# Patient Record
Sex: Female | Born: 1975 | Race: White | Hispanic: No | Smoking: Former smoker
Health system: Southern US, Community
[De-identification: ages and names within clinical notes are randomized; demographics above are authoritative.]

## PROBLEM LIST (undated history)

## (undated) DIAGNOSIS — M549 Dorsalgia, unspecified: Secondary | ICD-10-CM

## (undated) DIAGNOSIS — G8929 Other chronic pain: Secondary | ICD-10-CM

## (undated) DIAGNOSIS — F909 Attention-deficit hyperactivity disorder, unspecified type: Secondary | ICD-10-CM

## (undated) DIAGNOSIS — F32A Depression, unspecified: Secondary | ICD-10-CM

## (undated) DIAGNOSIS — E119 Type 2 diabetes mellitus without complications: Secondary | ICD-10-CM

## (undated) DIAGNOSIS — M1990 Unspecified osteoarthritis, unspecified site: Secondary | ICD-10-CM

## (undated) DIAGNOSIS — M7580 Other shoulder lesions, unspecified shoulder: Secondary | ICD-10-CM

## (undated) DIAGNOSIS — F329 Major depressive disorder, single episode, unspecified: Secondary | ICD-10-CM

## (undated) DIAGNOSIS — F419 Anxiety disorder, unspecified: Secondary | ICD-10-CM

## (undated) HISTORY — DX: Major depressive disorder, single episode, unspecified: F32.9

## (undated) HISTORY — DX: Attention-deficit hyperactivity disorder, unspecified type: F90.9

## (undated) HISTORY — DX: Anxiety disorder, unspecified: F41.9

## (undated) HISTORY — PX: CHOLECYSTECTOMY: SHX55

## (undated) HISTORY — PX: OTHER SURGICAL HISTORY: SHX169

## (undated) HISTORY — DX: Depression, unspecified: F32.A

## (undated) HISTORY — PX: TUBAL LIGATION: SHX77

---

## 2000-10-08 ENCOUNTER — Emergency Department (HOSPITAL_COMMUNITY): Admission: EM | Admit: 2000-10-08 | Discharge: 2000-10-08 | Payer: Self-pay | Admitting: Emergency Medicine

## 2000-10-26 ENCOUNTER — Inpatient Hospital Stay (HOSPITAL_COMMUNITY): Admission: AD | Admit: 2000-10-26 | Discharge: 2000-10-26 | Payer: Self-pay | Admitting: Obstetrics & Gynecology

## 2000-10-26 ENCOUNTER — Encounter: Payer: Self-pay | Admitting: *Deleted

## 2000-10-28 ENCOUNTER — Inpatient Hospital Stay (HOSPITAL_COMMUNITY): Admission: AD | Admit: 2000-10-28 | Discharge: 2000-10-28 | Payer: Self-pay | Admitting: Obstetrics & Gynecology

## 2001-01-28 ENCOUNTER — Emergency Department (HOSPITAL_COMMUNITY): Admission: EM | Admit: 2001-01-28 | Discharge: 2001-01-28 | Payer: Self-pay | Admitting: *Deleted

## 2001-06-23 ENCOUNTER — Emergency Department (HOSPITAL_COMMUNITY): Admission: EM | Admit: 2001-06-23 | Discharge: 2001-06-23 | Payer: Self-pay | Admitting: *Deleted

## 2001-08-01 ENCOUNTER — Inpatient Hospital Stay (HOSPITAL_COMMUNITY): Admission: AD | Admit: 2001-08-01 | Discharge: 2001-08-01 | Payer: Self-pay | Admitting: *Deleted

## 2002-07-13 ENCOUNTER — Inpatient Hospital Stay (HOSPITAL_COMMUNITY): Admission: EM | Admit: 2002-07-13 | Discharge: 2002-07-15 | Payer: Self-pay | Admitting: Emergency Medicine

## 2002-07-13 ENCOUNTER — Encounter: Payer: Self-pay | Admitting: Emergency Medicine

## 2003-10-25 ENCOUNTER — Emergency Department (HOSPITAL_COMMUNITY): Admission: EM | Admit: 2003-10-25 | Discharge: 2003-10-25 | Payer: Self-pay | Admitting: *Deleted

## 2003-10-27 ENCOUNTER — Emergency Department (HOSPITAL_COMMUNITY): Admission: EM | Admit: 2003-10-27 | Discharge: 2003-10-27 | Payer: Self-pay | Admitting: Emergency Medicine

## 2004-04-10 ENCOUNTER — Emergency Department (HOSPITAL_COMMUNITY): Admission: EM | Admit: 2004-04-10 | Discharge: 2004-04-10 | Payer: Self-pay | Admitting: Emergency Medicine

## 2004-04-20 ENCOUNTER — Observation Stay (HOSPITAL_COMMUNITY): Admission: EM | Admit: 2004-04-20 | Discharge: 2004-04-21 | Payer: Self-pay | Admitting: Emergency Medicine

## 2004-05-24 ENCOUNTER — Emergency Department (HOSPITAL_COMMUNITY): Admission: EM | Admit: 2004-05-24 | Discharge: 2004-05-24 | Payer: Self-pay | Admitting: Emergency Medicine

## 2004-05-28 ENCOUNTER — Emergency Department (HOSPITAL_COMMUNITY): Admission: EM | Admit: 2004-05-28 | Discharge: 2004-05-28 | Payer: Self-pay | Admitting: Emergency Medicine

## 2004-10-22 ENCOUNTER — Emergency Department (HOSPITAL_COMMUNITY): Admission: EM | Admit: 2004-10-22 | Discharge: 2004-10-22 | Payer: Self-pay | Admitting: *Deleted

## 2004-11-17 ENCOUNTER — Emergency Department (HOSPITAL_COMMUNITY): Admission: EM | Admit: 2004-11-17 | Discharge: 2004-11-17 | Payer: Self-pay | Admitting: Emergency Medicine

## 2004-12-07 ENCOUNTER — Emergency Department (HOSPITAL_COMMUNITY): Admission: EM | Admit: 2004-12-07 | Discharge: 2004-12-07 | Payer: Self-pay | Admitting: Emergency Medicine

## 2005-01-20 ENCOUNTER — Emergency Department (HOSPITAL_COMMUNITY): Admission: EM | Admit: 2005-01-20 | Discharge: 2005-01-20 | Payer: Self-pay | Admitting: Emergency Medicine

## 2005-01-22 ENCOUNTER — Emergency Department (HOSPITAL_COMMUNITY): Admission: EM | Admit: 2005-01-22 | Discharge: 2005-01-22 | Payer: Self-pay | Admitting: Emergency Medicine

## 2005-03-02 ENCOUNTER — Emergency Department (HOSPITAL_COMMUNITY): Admission: EM | Admit: 2005-03-02 | Discharge: 2005-03-03 | Payer: Self-pay | Admitting: Emergency Medicine

## 2005-04-21 ENCOUNTER — Emergency Department (HOSPITAL_COMMUNITY): Admission: EM | Admit: 2005-04-21 | Discharge: 2005-04-21 | Payer: Self-pay | Admitting: Emergency Medicine

## 2005-07-20 ENCOUNTER — Emergency Department (HOSPITAL_COMMUNITY): Admission: EM | Admit: 2005-07-20 | Discharge: 2005-07-20 | Payer: Self-pay | Admitting: Emergency Medicine

## 2005-08-11 ENCOUNTER — Emergency Department (HOSPITAL_COMMUNITY): Admission: EM | Admit: 2005-08-11 | Discharge: 2005-08-11 | Payer: Self-pay | Admitting: Emergency Medicine

## 2006-10-18 ENCOUNTER — Emergency Department (HOSPITAL_COMMUNITY): Admission: EM | Admit: 2006-10-18 | Discharge: 2006-10-18 | Payer: Self-pay | Admitting: Emergency Medicine

## 2008-03-06 ENCOUNTER — Emergency Department (HOSPITAL_COMMUNITY): Admission: EM | Admit: 2008-03-06 | Discharge: 2008-03-06 | Payer: Self-pay | Admitting: Emergency Medicine

## 2008-03-07 ENCOUNTER — Emergency Department (HOSPITAL_COMMUNITY): Admission: EM | Admit: 2008-03-07 | Discharge: 2008-03-07 | Payer: Self-pay | Admitting: Emergency Medicine

## 2008-03-09 ENCOUNTER — Emergency Department (HOSPITAL_COMMUNITY): Admission: EM | Admit: 2008-03-09 | Discharge: 2008-03-09 | Payer: Self-pay | Admitting: Emergency Medicine

## 2008-03-10 ENCOUNTER — Emergency Department (HOSPITAL_COMMUNITY): Admission: EM | Admit: 2008-03-10 | Discharge: 2008-03-10 | Payer: Self-pay | Admitting: Emergency Medicine

## 2008-03-11 ENCOUNTER — Emergency Department (HOSPITAL_COMMUNITY): Admission: EM | Admit: 2008-03-11 | Discharge: 2008-03-11 | Payer: Self-pay | Admitting: Emergency Medicine

## 2008-04-07 ENCOUNTER — Emergency Department (HOSPITAL_COMMUNITY): Admission: EM | Admit: 2008-04-07 | Discharge: 2008-04-07 | Payer: Self-pay | Admitting: Emergency Medicine

## 2009-01-08 ENCOUNTER — Emergency Department (HOSPITAL_COMMUNITY): Admission: EM | Admit: 2009-01-08 | Discharge: 2009-01-09 | Payer: Self-pay | Admitting: Emergency Medicine

## 2009-02-02 ENCOUNTER — Emergency Department (HOSPITAL_COMMUNITY): Admission: EM | Admit: 2009-02-02 | Discharge: 2009-02-02 | Payer: Self-pay | Admitting: Emergency Medicine

## 2009-02-04 ENCOUNTER — Emergency Department (HOSPITAL_COMMUNITY): Admission: EM | Admit: 2009-02-04 | Discharge: 2009-02-04 | Payer: Self-pay | Admitting: Emergency Medicine

## 2009-10-29 ENCOUNTER — Emergency Department (HOSPITAL_COMMUNITY): Admission: EM | Admit: 2009-10-29 | Discharge: 2009-10-29 | Payer: Self-pay | Admitting: Emergency Medicine

## 2010-07-27 ENCOUNTER — Emergency Department (HOSPITAL_COMMUNITY)
Admission: EM | Admit: 2010-07-27 | Discharge: 2010-07-27 | Disposition: A | Discharge status: Home / Self Care | Payer: Self-pay | Attending: Emergency Medicine | Admitting: Emergency Medicine

## 2010-07-27 ENCOUNTER — Emergency Department (HOSPITAL_COMMUNITY): Payer: Self-pay

## 2010-07-27 DIAGNOSIS — N200 Calculus of kidney: Secondary | ICD-10-CM | POA: Insufficient documentation

## 2010-07-27 DIAGNOSIS — R109 Unspecified abdominal pain: Secondary | ICD-10-CM | POA: Insufficient documentation

## 2010-07-27 LAB — URINE MICROSCOPIC-ADD ON

## 2010-07-27 LAB — URINALYSIS, ROUTINE W REFLEX MICROSCOPIC
Glucose, UA: NEGATIVE mg/dL
Specific Gravity, Urine: >1.03 — ABNORMAL HIGH (ref 1.005–1.030)

## 2010-08-04 LAB — BASIC METABOLIC PANEL
BUN: 8 mg/dL (ref 6–23)
GFR calc non Af Amer: >60 mL/min (ref >60)
Potassium: 4.3 mEq/L (ref 3.5–5.1)
Sodium: 137 mEq/L (ref 135–145)

## 2010-08-04 LAB — DIFFERENTIAL
Eosinophils Relative: 3 % (ref 0–5)
Lymphocytes Relative: 28 % (ref 12–46)
Lymphs Abs: 1.9 10*3/uL (ref 0.7–4.0)

## 2010-08-04 LAB — GC/CHLAMYDIA PROBE AMP, GENITAL
Chlamydia, DNA Probe: NEGATIVE
GC Probe Amp, Genital: NEGATIVE

## 2010-08-04 LAB — URINALYSIS, ROUTINE W REFLEX MICROSCOPIC
Bilirubin Urine: NEGATIVE
Hgb urine dipstick: NEGATIVE
Ketones, ur: NEGATIVE mg/dL
Protein, ur: NEGATIVE mg/dL
Urobilinogen, UA: 0.2 mg/dL (ref 0.0–1.0)

## 2010-08-04 LAB — WET PREP, GENITAL: Trich, Wet Prep: NONE SEEN

## 2010-08-04 LAB — CBC
HCT: 27.8 % — ABNORMAL LOW (ref 36.0–46.0)
Platelets: 304 10*3/uL (ref 150–400)
WBC: 7 10*3/uL (ref 4.0–10.5)

## 2010-08-22 LAB — DIFFERENTIAL
Basophils Absolute: 0 10*3/uL (ref 0.0–0.1)
Lymphocytes Relative: 12 % (ref 12–46)
Neutro Abs: 9 10*3/uL — ABNORMAL HIGH (ref 1.7–7.7)
Neutrophils Relative %: 79 % — ABNORMAL HIGH (ref 43–77)

## 2010-08-22 LAB — URINE CULTURE: Colony Count: >=100000

## 2010-08-22 LAB — URINALYSIS, ROUTINE W REFLEX MICROSCOPIC
Bilirubin Urine: NEGATIVE
Nitrite: NEGATIVE
Protein, ur: 100 mg/dL — AB
Specific Gravity, Urine: 1.02 (ref 1.005–1.030)
Urobilinogen, UA: 0.2 mg/dL (ref 0.0–1.0)

## 2010-08-22 LAB — COMPREHENSIVE METABOLIC PANEL
BUN: 28 mg/dL — ABNORMAL HIGH (ref 6–23)
CO2: 12 mEq/L — ABNORMAL LOW (ref 19–32)
Chloride: 102 mEq/L (ref 96–112)
Creatinine, Ser: 2.22 mg/dL — ABNORMAL HIGH (ref 0.4–1.2)
GFR calc non Af Amer: 26 mL/min — ABNORMAL LOW (ref >60)
Glucose, Bld: 121 mg/dL — ABNORMAL HIGH (ref 70–99)
Total Bilirubin: 0.3 mg/dL (ref 0.3–1.2)

## 2010-08-22 LAB — CBC
HCT: 33.3 % — ABNORMAL LOW (ref 36.0–46.0)
MCV: 83.8 fL (ref 78.0–100.0)
Platelets: 331 10*3/uL (ref 150–400)
RBC: 3.98 MIL/uL (ref 3.87–5.11)
WBC: 11.4 10*3/uL — ABNORMAL HIGH (ref 4.0–10.5)

## 2010-08-22 LAB — URINE MICROSCOPIC-ADD ON

## 2010-08-22 LAB — LIPASE, BLOOD: Lipase: 25 U/L (ref 11–59)

## 2010-08-23 LAB — URINALYSIS, ROUTINE W REFLEX MICROSCOPIC
Ketones, ur: NEGATIVE mg/dL
Nitrite: NEGATIVE
Urobilinogen, UA: 1 mg/dL (ref 0.0–1.0)
pH: 8.5 — ABNORMAL HIGH (ref 5.0–8.0)

## 2010-08-23 LAB — URINE MICROSCOPIC-ADD ON

## 2010-10-03 NOTE — Discharge Summary (Signed)
NAME:  Ruth [name], Ruth [name]               ACCOUNT NO.:  [id number]   MEDICAL RECORD NO.:  [id number]          PATIENT TYPE:  INP   LOCATION:  A419                          FACILITY:  [facility]   PHYSICIAN:  Langley Gauss, MD     DATE OF BIRTH:  [birth year]   DATE OF ADMISSION:  04/20/2004  DATE OF DISCHARGE:  12/05/2005LH                                 DISCHARGE SUMMARY   OBSERVATION:  Observation period extended from December 4 to April 21, 2004.   FINAL DIAGNOSES:  1.  Left nephrolithiasis with pain.  2.  Intermittent hematuria.   DISPOSITION:  The patient is to follow up with practitioner of her choice.   DISCHARGE MEDICATIONS:  Tylox #30 for pain relief.   PERTINENT LABORATORY STUDIES:  Reveal hemoglobin of 15.3, hematocrit 45.3  with a white count of 8.0.  Electrolytes essentially within normal limits.  Urine pregnancy test is negative.  Initial urinalysis, small leukocyte  esterase, a few epithelial cells, 7-10 white cells, a few bacteria.  Preliminary clean catch results are negative.  Wet prep, WBC's moderate, no  trichomonas, no yeast, no clue cells.  RPR is nonreactive.  GC and chlamydia  cultures are negative.  A CT of the abdomen and pelvis have been requested  per the emergency room physician which reveal the presence of left  nephrolithiasis with no evidence of ureteral obstruction.   HOSPITAL COURSE:  A 35 year old gravida 2, para 2, two prior vaginal  deliveries, tubal ligation done about three years previously, presented to  the emergency room complaining of an 18-hour duration of sudden onset of  left flank pain beginning in the back and came around to the front.  The  pain was described as constant, dull and aching.  She did have nausea.  She  denied any vomiting.  She had diarrhea one day previously.  Upon  presentation to the emergency room, the patient described the pain as 10/10.  The patient did arrive in the emergency room via EMS.  She is initially  evaluated  in the emergency room by Dr. Rhae Lerner. [place] and then by Dr.  Ronne Binning.  She also complains of urinary urgency, headache.  She was recently  seen and treated in the emergency room for urinary tract infection with 1  gram of p.o. Levaquin.  This was on Thanksgiving Day.   PAST MEDICAL HISTORY:  Otherwise negative.   SURGICAL HISTORY:  Negative other than for tubal ligation.  The patient is a  heavy smoker.  She made frequent trips during this hospitalization outside  to consume cigarettes.   GYN:  Last menstrual period was described as April 16, 2004, and was  described as normal.   MEDICATIONS:  1.  Rocephin 1 gram.  2.  She was also treated with IV Dilaudid as well as IV Zofran.   PHYSICAL EXAMINATION:  VITAL SIGNS:  Blood pressure 129/81, 107, respiratory  rate 20.  HEENT:  Negative.  No adenopathy.  NECK:  Supple.  Thyroid is nonpalpable.  LUNGS:  Clear.  CARDIOVASCULAR:  Regular rate and rhythm.  ABDOMEN:  Soft,  nontender.  No rebound or guarding identified.  The patient  is complaining of pain in the left upper quadrant as well as some left flank  tenderness.  PELVIC:  Examination per the ER staff, the patient presumably had a  mucopurulent discharge present and the endocervical os, and uterus was  tender to manipulation and palpation.   LABORATORY DATA:  Laboratory studies and radiological studies as per  previous.   The patient was evaluated in the emergency room by Dr. Rhae Lerner. [place],  and particularly by Dr. Ronne Binning.  The presumptive diagnosis according to Dr.  Ronne Binning was that of pelvic inflammatory disease, and he was consulted for  continuing care.  Verbal orders were given that the patient be treated with  IV doxycycline as well as IV Rocephin and continued evaluation.  On  evaluation the p.m. of April 20, 2004, it became apparent that the patient  had minimal complaints of pelvic or suprapubic pain, rather primarily left  upper quadrant with some flank pain.   Officially noted was the CT scan  showing the nephrolithiasis.  Thus the diagnosis of pelvic inflammatory  disease was very much in question with the presence of the renal calculi  present.  The patient, however, was having significant nausea and vomiting.  Thus, she was continued to be treated with IV fluids and did receive IM  Phenergan due to her poor vascular access.  She remained afebrile.  On the  a.m. of 04/21/04, the patient did say that she developed some transient but  obviously gross hematuria, and passed some crushed, gravel-like substance  which she does report was shown to the nursing staff.  She continues to be  afebrile at the time of evaluation.  The p.m. of April 21, 2004, the  patient is again afebrile.  The pain has largely abated.  The nausea has  resolved. She is very eagerly eating the meal provided for her.  Additionally, cigarette break very soon after arrival to the 4th floor on  April 20, 2004.  She made frequent trips today on April 21, 2004.  These  findings all lead to the diagnosis of left nephrolithiasis with spontaneous  passage of small gravel-like stone.  Thus, the patient is discharged  following this observation, now on April 21, 2004.  She will receive p.o.  Tylox only for pain relief.     Vira Blanco   DC/MEDQ  D:  04/21/2004  T:  04/22/2004  Job:  [id number]

## 2010-10-03 NOTE — Discharge Summary (Signed)
   NAME:  Ruth [name], Ruth [name]                        ACCOUNT NO.:  [id number]   MEDICAL RECORD NO.:  [id number]                   PATIENT TYPE:  INP   LOCATION:  A318                                 FACILITY:  [facility]   PHYSICIAN:  Dalia Heading, M.D.               DATE OF BIRTH:  Jul 11, 1975   DATE OF ADMISSION:  07/13/2002  DATE OF DISCHARGE:  07/15/2002                                 DISCHARGE SUMMARY   HOSPITAL COURSE SUMMARY:  The patient is a 35 year old white female who  presented to the emergency room with right upper quadrant abdominal pain and  leukocytosis.  She was found on ultrasound to have acute cholecystitis  secondary to cholelithiasis.  The patient was admitted to the hospital and  subsequently underwent a laparoscopic cholecystectomy on July 14, 2002.  She tolerated the procedure well.  Her postoperative course was remarkable  only for mild pruritus secondary to Lortab; she has been switched to  Darvocet as she has had Darvocet before without difficulty.   The patient is being discharged home on July 15, 2002 in good and  improving condition.   DISCHARGE INSTRUCTIONS:  The patient is to follow up with Dr. Dalia Heading on July 20, 2002.   DISCHARGE MEDICATIONS:  1. Darvocet-N 100 one to two tablets p.o. q.4h. p.r.n. pain.  2. Xanax 1 mg p.o. q.8h. p.r.n. anxiety.   PRINCIPAL DIAGNOSIS:  Acute cholecystitis secondary to cholelithiasis.   PRINCIPAL PROCEDURE:  Laparoscopic cholecystectomy on July 14, 2002.                                               Dalia Heading, M.D.    MAJ/MEDQ  D:  07/15/2002  T:  07/15/2002  Job:  [id number]

## 2010-10-03 NOTE — Op Note (Signed)
NAME:  Ruth [name], Ruth [name]                        ACCOUNT NO.:  [id number]   MEDICAL RECORD NO.:  [id number]                   PATIENT TYPE:  INP   LOCATION:  A318                                 FACILITY:  [facility]   PHYSICIAN:  Dalia Heading, M.D.               DATE OF BIRTH:  November 13, 1975   DATE OF PROCEDURE:  07/14/2002  DATE OF DISCHARGE:                                 OPERATIVE REPORT   PREOPERATIVE DIAGNOSIS:  Acute cholecystitis, cholelithiasis.   POSTOPERATIVE DIAGNOSIS:  Acute cholecystitis, cholelithiasis.   PROCEDURE:  Laparoscopic cholecystectomy.   SURGEON:  Dalia Heading, M.D.   ANESTHESIA:  General endotracheal.   INDICATIONS:  The patient is a 35 year old white female who presents with  acute cholecystitis secondary to cholelithiasis.  The risks and benefits of  the procedure, including bleeding, infection, hepatobiliary injury, and the  possibility of an open procedure, were fully explained to the patient, who  gave informed consent.   DESCRIPTION OF PROCEDURE:  The patient was placed in the supine position.  After induction of general endotracheal anesthesia, the abdomen was prepped  and draped using the usual sterile technique with Betadine.  Surgical site  confirmation was performed.   A supraumbilical incision was made down to the fascia.  A Veress needle was  introduced into the abdominal cavity and confirmation of placement was done  using the saline drop test.  The abdomen was then insufflated to 16 mmHg  pressure.  An 11 mm trocar was introduced into the abdominal cavity under  direct visualization without difficulty. The patient was placed in reverse  Trendelenburg position and an additional 11 mm trocar was placed in the  epigastric region and 5 mm trocars were placed in the right upper quadrant  and right flank regions.  The liver was inspected and noted to be within  normal limits.  The gallbladder was noted to be distended and edematous.  A  needle was advanced into the gallbladder lumen, and hydrops of the  gallbladder was found.  The gallbladder was then retracted superior and  laterally.  The dissection was begun around the infundibulum of the  gallbladder.  The cystic duct was first identified.  Its juncture to the  infundibulum fully identified.  Endoclips were placed proximally and  distally on the cystic duct, and the cystic duct was divided.  This was  likewise done to the cystic artery.  The gallbladder was then freed away  from the gallbladder fossa using the Bovie electrocautery.  The gallbladder  was delivered through the epigastric trocar site using an EndoCatch bag  without difficulty.  The gallbladder fossa was inspected and no abnormal  bleeding or bile leakage was noted.  Surgicel was placed in the gallbladder  fossa.  The subhepatic space as well as the right hepatic gutter were  evacuated of all fluid and air prior to removal of the trocars.   All wounds  were irrigated with normal saline.  All wounds were injected with  0.5% Sensorcaine.  The supraumbilical fascia as well as epigastric fascia  were reapproximated using an 0 Vicryl interrupted suture.  All skin  incisions were closed using staples.  Betadine ointment and dry sterile  dressings were applied.   All tape and needle counts correct at the end of the procedure.  The patient  was extubated in the operating room and went back to the recovery room  awake, in stable condition.   COMPLICATIONS:  None.   SPECIMENS:  Gallbladder with stones.   ESTIMATED BLOOD LOSS:  Minimal.                                               Dalia Heading, M.D.    MAJ/MEDQ  D:  07/14/2002  T:  07/14/2002  Job:  [id number]

## 2010-10-03 NOTE — H&P (Signed)
   NAME:  Ruth [name], Ruth [name]                        ACCOUNT NO.:  [id number]   MEDICAL RECORD NO.:  [id number]                   PATIENT TYPE:  EMS   LOCATION:  ED                                   FACILITY:  [facility]   PHYSICIAN:  Dalia Heading, M.D.               DATE OF BIRTH:  [birth year]   DATE OF ADMISSION:  07/13/2002  DATE OF DISCHARGE:                                HISTORY & PHYSICAL   CHIEF COMPLAINT:  Cholecystitis, cholelithiasis.   HISTORY OF PRESENT ILLNESS:  The patient is a 35 year old white female who  began experiencing right upper quadrant pain and nausea yesterday evening.  She presented to the emergency room and an ultrasound was performed which  revealed cholecystitis secondary to cholelithiasis.  She denies any fever,  chills, or jaundice.   PAST MEDICAL HISTORY:  Unremarkable.   PAST SURGICAL HISTORY:  Unremarkable.   CURRENT MEDICATIONS:  None.   ALLERGIES:  No known drug allergies.   REVIEW OF SYSTEMS:  The patient does smoke.  She denies any significant  alcohol use.   PHYSICAL EXAMINATION:  GENERAL:  The patient is a well-developed, well-  nourished white female in moderate discomfort.  VITAL SIGNS:  She is afebrile and vital signs are stable.  HEENT:  No scleral icterus.  LUNGS:  Clear to auscultation with equal breath sounds bilaterally.  HEART:  Regular rate and rhythm without S3, S4, or murmurs.  ABDOMEN:  Soft with tenderness noted in the right upper quadrant to  palpation.  No hepatosplenomegaly, masses, or hernias are identified.   LABORATORIES:  White blood cell count 15.8.  Hemoglobin and platelet counts  are within normal limits.  MET-7 is within normal limits.  Liver profile is  within normal limits.  Amylase and lipase are normal.  Beta hCG is negative.   IMPRESSION:  Cholecystitis, cholelithiasis.    PLAN:  The patient will be admitted to the hospital for IV antibiotic and  pain control.  A laparoscopic cholecystectomy will be  performed within the  next 24 hours.  The risks and benefits of the procedure including bleeding,  infection, hepatobiliary injury, the possibility of an open procedure were  fully explained to the patient, gives informed consent.                                               Dalia Heading, M.D.    MAJ/MEDQ  D:  07/13/2002  T:  07/13/2002  Job:  [id number]

## 2011-02-16 LAB — URINALYSIS, ROUTINE W REFLEX MICROSCOPIC
Bilirubin Urine: NEGATIVE
Glucose, UA: NEGATIVE
Ketones, ur: NEGATIVE
Ketones, ur: NEGATIVE
Leukocytes, UA: NEGATIVE
Nitrite: NEGATIVE
Nitrite: NEGATIVE
Protein, ur: NEGATIVE
Specific Gravity, Urine: 1.025
Urobilinogen, UA: 0.2
Urobilinogen, UA: 0.2
pH: 5.5

## 2011-02-16 LAB — URINE MICROSCOPIC-ADD ON

## 2011-02-16 LAB — BASIC METABOLIC PANEL
BUN: 9
CO2: 20
Calcium: 8.9
Glucose, Bld: 124 — ABNORMAL HIGH
Potassium: 4.2
Sodium: 134 — ABNORMAL LOW

## 2011-02-16 LAB — URINE CULTURE

## 2011-02-16 LAB — PREGNANCY, URINE: Preg Test, Ur: NEGATIVE

## 2012-02-13 ENCOUNTER — Emergency Department (HOSPITAL_COMMUNITY)
Admission: EM | Admit: 2012-02-13 | Discharge: 2012-02-13 | Disposition: A | Discharge status: Home / Self Care | Payer: Self-pay | Attending: Emergency Medicine | Admitting: Emergency Medicine

## 2012-02-13 ENCOUNTER — Emergency Department (HOSPITAL_COMMUNITY): Payer: Self-pay

## 2012-02-13 ENCOUNTER — Encounter (HOSPITAL_COMMUNITY): Payer: Self-pay | Admitting: *Deleted

## 2012-02-13 DIAGNOSIS — R300 Dysuria: Secondary | ICD-10-CM | POA: Insufficient documentation

## 2012-02-13 DIAGNOSIS — R233 Spontaneous ecchymoses: Secondary | ICD-10-CM | POA: Insufficient documentation

## 2012-02-13 DIAGNOSIS — N200 Calculus of kidney: Secondary | ICD-10-CM | POA: Insufficient documentation

## 2012-02-13 DIAGNOSIS — R109 Unspecified abdominal pain: Secondary | ICD-10-CM | POA: Insufficient documentation

## 2012-02-13 DIAGNOSIS — R319 Hematuria, unspecified: Secondary | ICD-10-CM

## 2012-02-13 DIAGNOSIS — R070 Pain in throat: Secondary | ICD-10-CM | POA: Insufficient documentation

## 2012-02-13 DIAGNOSIS — R739 Hyperglycemia, unspecified: Secondary | ICD-10-CM

## 2012-02-13 DIAGNOSIS — R1110 Vomiting, unspecified: Secondary | ICD-10-CM | POA: Insufficient documentation

## 2012-02-13 DIAGNOSIS — R7309 Other abnormal glucose: Secondary | ICD-10-CM | POA: Insufficient documentation

## 2012-02-13 LAB — URINE MICROSCOPIC-ADD ON

## 2012-02-13 LAB — CBC
MCH: 37 pg — ABNORMAL HIGH (ref 26.0–34.0)
MCHC: 35.2 g/dL (ref 30.0–36.0)
Platelets: 104 10*3/uL — ABNORMAL LOW (ref 150–400)
RDW: 12.8 % (ref 11.5–15.5)

## 2012-02-13 LAB — URINALYSIS, ROUTINE W REFLEX MICROSCOPIC
Bilirubin Urine: NEGATIVE
Ketones, ur: NEGATIVE mg/dL
Specific Gravity, Urine: 1.02 (ref 1.005–1.030)
pH: 6.5 (ref 5.0–8.0)

## 2012-02-13 LAB — BASIC METABOLIC PANEL
Calcium: 9.2 mg/dL (ref 8.4–10.5)
GFR calc Af Amer: >90 mL/min (ref >90)
GFR calc non Af Amer: >90 mL/min (ref >90)
Sodium: 134 mEq/L — ABNORMAL LOW (ref 135–145)

## 2012-02-13 LAB — PREGNANCY, URINE: Preg Test, Ur: NEGATIVE

## 2012-02-13 LAB — RAPID STREP SCREEN (MED CTR MEBANE ONLY): Streptococcus, Group A Screen (Direct): NEGATIVE

## 2012-02-13 MED ORDER — NITROFURANTOIN MONOHYD MACRO 100 MG PO CAPS: 100.0000 mg | ORAL_CAPSULE | Freq: Two times a day (BID) | ORAL | Status: DC

## 2012-02-13 MED ORDER — OXYCODONE-ACETAMINOPHEN 5-325 MG PO TABS: 1.0000 | ORAL_TABLET | Freq: Four times a day (QID) | ORAL | Status: DC | PRN

## 2012-02-13 MED ORDER — PROMETHAZINE HCL 25 MG PO TABS: 25.0000 mg | ORAL_TABLET | Freq: Four times a day (QID) | ORAL | Status: DC | PRN

## 2012-02-13 NOTE — ED Notes (Signed)
Pt c/o lower abd pressure/pain on urination, vomited x3 yesterday, chills. Symptoms have been present x 1 week. Pt stated she has history of frequent UTI's.

## 2012-02-13 NOTE — ED Provider Notes (Signed)
History   This chart was scribed for Tobin Chad, MD, by Frederik Pear. The patient was seen in room APA06/APA06 and the patient's care was started at 0756.    CSN: [id number]  Arrival date & time 02/13/12  0751   First MD Initiated Contact with Patient 02/13/12 0756      No chief complaint on file.   (Consider location/radiation/quality/duration/timing/severity/associated sxs/prior treatment) HPI Comments: KISCHA ALTICE is a 36 y.o. female who presents to the Emergency Department complaining of moderate, gradually worsening dyrsuria that began 7 days ago.Pt reports associated vomiting that began last night at 10 pm.  Pt also reports associated sore throat as well as lower back pain, generalized body aches, and a rash with purple blotches on the face. Pt denies coughing, ear pain, chest or upper abdominal pain. Pt has been self-treating with AZO for the past week with minimal relief. Pt has a h/o of UTI, but has never been hospitalized and has not received any antibiotics for previous episodes for the past 1.5 years. Pt reports no other [facility] term health issues and does not take any medication on a daily basis. She states she does not use any drugs or alcohol, but smokes approximately 5 cigarettes a day. Pt has previously had a tubal ligation and cholecystectomy. Pt's mother has a h/o of DM, to which she had her leg amputated in her early 47s. There is no h/o DM with pt's siblings.    PCP is [facility].    Patient is a 36 y.o. female presenting with urinary tract infection.  Urinary Tract Infection Associated symptoms include chest pain. Pertinent negatives include no abdominal pain and no shortness of breath.    No past medical history on file.  No past surgical history on file.  No family history on file.  History  Substance Use Topics  . Smoking status: Not on file  . Smokeless tobacco: Not on file  . Alcohol Use: Not on file    OB History    No  data available      Review of Systems  Constitutional: Positive for fatigue. Negative for activity change and appetite change.       Pt reports generalized body aches.  HENT: Positive for sore throat. Negative for hearing loss, ear pain, congestion, rhinorrhea and ear discharge.   Eyes: Negative.   Respiratory: Negative for cough, chest tightness and shortness of breath.        Pt denies chest pain.  Cardiovascular: Positive for chest pain. Negative for leg swelling.       Chronic - not an acute issue.  Gastrointestinal: Positive for nausea and vomiting. Negative for abdominal pain, diarrhea and constipation.  Genitourinary: Positive for dysuria, urgency, decreased urine volume and difficulty urinating. Negative for hematuria.  Skin: Positive for color change and rash. Negative for pallor and wound.       "My face is red from the vomiting"  Neurological: Negative.   Hematological: Negative.   Psychiatric/Behavioral: Negative.   All other systems reviewed and are negative.    Allergies  Review of patient's allergies indicates not on file.  Home Medications  No current outpatient prescriptions on file.  There were no vitals taken for this visit.  Physical Exam  Nursing note and vitals reviewed. Constitutional: She is oriented to person, place, and time. She appears well-developed and well-nourished. No distress.  HENT:  Head: Normocephalic and atraumatic.  Right Ear: External ear normal.  Left Ear: External ear  normal.  Nose: Nose normal.  Mouth/Throat: Oropharynx is clear and moist. No oropharyngeal exudate.  Eyes: Conjunctivae normal and EOM are normal. Pupils are equal, round, and reactive to light. Right eye exhibits no discharge. Left eye exhibits no discharge. No scleral icterus.  Neck: Normal range of motion. Neck supple. No JVD present. No tracheal deviation present. No thyromegaly present.  Cardiovascular: Normal rate, regular rhythm, normal heart sounds and intact  distal pulses.  Exam reveals no gallop and no friction rub.   No murmur heard. Pulmonary/Chest: Effort normal and breath sounds normal. No stridor. No respiratory distress. She has no wheezes. She has no rales. She exhibits no tenderness.  Abdominal: Soft. Bowel sounds are normal. She exhibits no distension and no mass. There is tenderness in the suprapubic area. There is no rebound and no guarding.  Musculoskeletal: Normal range of motion. She exhibits no edema and no tenderness.  Lymphadenopathy:    She has no cervical adenopathy.  Neurological: She is alert and oriented to person, place, and time. Coordination normal.  Skin: Skin is warm and dry. Petechiae noted. She is not diaphoretic.       + petechiae limited to face. Most noticeable around eyes.  Psychiatric: She has a normal mood and affect. Her behavior is normal.    ED Course  Procedures (including critical care time)  DIAGNOSTIC STUDIES: Oxygen Saturation is 97% on normal, normal by my interpretation.    COORDINATION OF CARE:  08:45- Discussed planned course of treatment with the patient, including UA, who is agreeable at this time.  12:17- Recheck with pt to discuss UA results.   Results for orders placed during the hospital encounter of 02/13/12  URINALYSIS, ROUTINE W REFLEX MICROSCOPIC      Component Value Range   Color, Urine ORANGE (*) YELLOW   APPearance HAZY (*) CLEAR   Specific Gravity, Urine 1.020  1.005 - 1.030   pH 6.5  5.0 - 8.0   Glucose, UA 250 (*) NEGATIVE mg/dL   Hgb urine dipstick LARGE (*) NEGATIVE   Bilirubin Urine NEGATIVE  NEGATIVE   Ketones, ur NEGATIVE  NEGATIVE mg/dL   Protein, ur 956 (*) NEGATIVE mg/dL   Urobilinogen, UA 4.0 (*) 0.0 - 1.0 mg/dL   Nitrite POSITIVE (*) NEGATIVE   Leukocytes, UA NEGATIVE  NEGATIVE  GLUCOSE, CAPILLARY      Component Value Range   Glucose-Capillary 287 (*) 70 - 99 mg/dL  URINE MICROSCOPIC-ADD ON      Component Value Range   Squamous Epithelial / LPF FEW (*)  RARE   WBC, UA 3-6  <3 WBC/hpf   RBC / HPF 21-50  <3 RBC/hpf   Bacteria, UA FEW (*) RARE   Urine-Other MUCOUS PRESENT       Labs Reviewed - No data to display No results found.   No diagnosis found.    MDM  Pt presents for evaluation of difficulty urinating.  She also reports having a sore throat.  Will obtain a urinalysis ant rapid strep.  She currently appears nontoxic, NAD.  Rapid strep is negative.  U/A demonstrates hematuria.  Nitrites are positive without elevated WBCs or leukocytes.  Given the symptoms she is having, will treat with a course of bactrim.  U-preg is negative.  Stone protocol CT scan does not demonstrate a ureteral or bladder calculi.  She did however have pain that began abruptly and subsided quickly similar to ureteral colic.  She has multiple bilateral kidney stones on CT scan.  Her presentation  appears consistent with a resolving ureteral colic.   Lastly, she has elevated blood sugar and glucose in her urine.  She has used her mother's glucometer recently and each time checked, she reports a blood sugar greater than 200.  Her mother is a type II diabetic.  This history is concerning for a new diagnosis of type II DM.  She has an already arranged appointment for [facility] the [facility].  Plan discharge home.   I personally performed the services described in this documentation, which was scribed in my presence. The recorded information has been reviewed and considered.      Tobin Chad, MD 02/13/12 1257

## 2012-02-13 NOTE — ED Notes (Signed)
Pt stated she has had vaginal bleeding x 2 months.

## 2013-08-27 ENCOUNTER — Emergency Department (HOSPITAL_COMMUNITY)
Admission: EM | Admit: 2013-08-27 | Discharge: 2013-08-27 | Disposition: A | Discharge status: Home / Self Care | Payer: Medicaid Other | Attending: Emergency Medicine | Admitting: Emergency Medicine

## 2013-08-27 ENCOUNTER — Encounter (HOSPITAL_COMMUNITY): Payer: Self-pay | Admitting: Emergency Medicine

## 2013-08-27 DIAGNOSIS — M5416 Radiculopathy, lumbar region: Secondary | ICD-10-CM

## 2013-08-27 DIAGNOSIS — Z79899 Other long term (current) drug therapy: Secondary | ICD-10-CM | POA: Insufficient documentation

## 2013-08-27 DIAGNOSIS — R739 Hyperglycemia, unspecified: Secondary | ICD-10-CM

## 2013-08-27 DIAGNOSIS — IMO0002 Reserved for concepts with insufficient information to code with codable children: Secondary | ICD-10-CM | POA: Insufficient documentation

## 2013-08-27 DIAGNOSIS — E119 Type 2 diabetes mellitus without complications: Secondary | ICD-10-CM | POA: Insufficient documentation

## 2013-08-27 DIAGNOSIS — F17200 Nicotine dependence, unspecified, uncomplicated: Secondary | ICD-10-CM | POA: Insufficient documentation

## 2013-08-27 HISTORY — DX: Type 2 diabetes mellitus without complications: E11.9

## 2013-08-27 LAB — URINALYSIS, ROUTINE W REFLEX MICROSCOPIC
Bilirubin Urine: NEGATIVE
Glucose, UA: >1000 mg/dL — AB
Hgb urine dipstick: NEGATIVE
Ketones, ur: NEGATIVE mg/dL
Nitrite: NEGATIVE
Protein, ur: NEGATIVE mg/dL
Specific Gravity, Urine: 1.01 (ref 1.005–1.030)
Urobilinogen, UA: 0.2 mg/dL (ref 0.0–1.0)
pH: 7 (ref 5.0–8.0)

## 2013-08-27 LAB — CBG MONITORING, ED: Glucose-Capillary: 352 mg/dL — ABNORMAL HIGH (ref 70–99)

## 2013-08-27 LAB — URINE MICROSCOPIC-ADD ON

## 2013-08-27 MED ORDER — IBUPROFEN 800 MG PO TABS
800.0000 mg | ORAL_TABLET | Freq: Once | ORAL | Status: AC
  Administered 2013-08-27: 800 mg via ORAL
  Filled 2013-08-27: qty 1

## 2013-08-27 MED ORDER — HYDROCODONE-ACETAMINOPHEN 5-325 MG PO TABS: 1.0000 | ORAL_TABLET | ORAL | Status: DC | PRN

## 2013-08-27 MED ORDER — HYDROCODONE-ACETAMINOPHEN 5-325 MG PO TABS
1.0000 | ORAL_TABLET | Freq: Once | ORAL | Status: AC
  Administered 2013-08-27: 1 via ORAL
  Filled 2013-08-27: qty 1

## 2013-08-27 MED ORDER — IBUPROFEN 600 MG PO TABS: 600.0000 mg | ORAL_TABLET | Freq: Four times a day (QID) | ORAL | Status: DC | PRN

## 2013-08-27 NOTE — ED Provider Notes (Signed)
CSN: [id number]     Arrival date & time 08/27/13  1659 History   Chief Complaint  Patient presents with  . Back Pain   The history is provided by the patient. No language interpreter was used.   HPI Comments:  Ruth [name] is a 38 y.o. female who presents to the Emergency Department complaining of acute lower back pain which has been present for the past week.  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  She describes injury  which started when she attempted to pull her mother up in bed last week.   Her pain is aching, constant with movement and palpation and better at rest.    There is radiation of burning pain into her bilateral lower legs to her ankles.  There has been no weakness or numbness in the lower extremities and no urinary or bowel retention or incontinence. She does have a history of diabetes and has frequent episodes of uti's.  She is currently having increased urinary frequency and urgency without hematuria or painful urination.  She has not had any urinary or fecal incontinence or retention.   Patient does not have a history of cancer or IVDU.  She has taken no medicines for her symptoms.   Past Medical History  Diagnosis Date  . Diabetes mellitus without complication    Past Surgical History  Procedure Laterality Date  . Cholecystectomy    . Tubual ligation     History reviewed. No pertinent family history. History  Substance Use Topics  . Smoking status: Light Tobacco Smoker -- 0.50 packs/day  . Smokeless tobacco: Not on file  . Alcohol Use: No   OB History   Grav Para Term Preterm Abortions TAB SAB Ect Mult Living                 Review of Systems  Constitutional: Negative for fever.  Respiratory: Negative for shortness of breath.   Cardiovascular: Negative for chest pain and leg swelling.  Gastrointestinal: Negative for abdominal pain, constipation and abdominal  distention.  Genitourinary: Positive for urgency and frequency. Negative for dysuria, hematuria, flank pain, decreased urine volume and difficulty urinating.  Musculoskeletal: Positive for back pain. Negative for gait problem and joint swelling.  Skin: Negative for rash.  Neurological: Negative for weakness and numbness.    Allergies  Review of patient's allergies indicates no known allergies.  Home Medications   Current Outpatient Rx  Name  Route  Sig  Dispense  Refill  . diazepam (VALIUM) 5 MG tablet   Oral   Take 2.5 mg by mouth every 8 (eight) hours as needed for anxiety.         . sitaGLIPtin-metformin (JANUMET) 50-1000 MG per tablet   Oral   Take 1 tablet by mouth 2 (two) times daily with a meal.         . HYDROcodone-acetaminophen (NORCO/VICODIN) 5-325 MG per tablet   Oral   Take 1 tablet by mouth every 4 (four) hours as needed.   20 tablet   0   . ibuprofen (ADVIL,MOTRIN) 600 MG tablet   Oral   Take 1 tablet (600 mg total) by mouth every 6 (six) hours as needed.   30 tablet   0    Triage Vitals: BP 146/84  Pulse 94  Temp(Src) 97.9 F (36.6 C) (Oral)  Resp 15  Ht 5\' 4"  (1.626 m)  Wt 180 lb (81.647 kg)  BMI 30.88 kg/m2  SpO2 98%  LMP 08/27/2012 Physical Exam  Nursing note and vitals reviewed. Constitutional: She appears well-developed and well-nourished.  HENT:  Head: Normocephalic.  Eyes: Conjunctivae are normal.  Neck: Normal range of motion. Neck supple.  Cardiovascular: Normal rate and intact distal pulses.   Pedal pulses normal.  Pulmonary/Chest: Effort normal.  Abdominal: Soft. Bowel sounds are normal. She exhibits no distension and no mass.  Musculoskeletal: Normal range of motion. She exhibits no edema.       Lumbar back: She exhibits tenderness. She exhibits no swelling, no edema and no spasm.  Paralumbar ttp.  No midline pain, no SI joint ttp.  Neurological: She is alert. She has normal strength. She displays no atrophy and no tremor. No  sensory deficit. Gait normal.  Reflex Scores:      Patellar reflexes are 2+ on the right side and 2+ on the left side.      Achilles reflexes are 2+ on the right side and 2+ on the left side. No strength deficit noted in hip and knee flexor and extensor muscle groups.  Ankle flexion and extension intact.  Skin: Skin is warm and dry.  Psychiatric: She has a normal mood and affect.    ED Course  Procedures (including critical care time) DIAGNOSTIC STUDIES: Oxygen Saturation is 98% on RA, normal by my interpretation.   COORDINATION OF CARE: 1:35 AM- Pt verbalizes understanding and agrees to plan.  Medications  ibuprofen (ADVIL,MOTRIN) tablet 800 mg (800 mg Oral Given 08/27/13 1851)  HYDROcodone-acetaminophen (NORCO/VICODIN) 5-325 MG per tablet 1 tablet (1 tablet Oral Given 08/27/13 1851)    Labs Review Labs Reviewed  URINALYSIS, ROUTINE W REFLEX MICROSCOPIC - Abnormal; Notable for the following:    APPearance CLOUDY (*)    Glucose, UA >1000 (*)    Leukocytes, UA SMALL (*)    All other components within normal limits  URINE MICROSCOPIC-ADD ON - Abnormal; Notable for the following:    Squamous Epithelial / LPF MANY (*)    All other components within normal limits  CBG MONITORING, ED - Abnormal; Notable for the following:    Glucose-Capillary 352 (*)    All other components within normal limits   Imaging Review No results found.   EKG Interpretation None      MDM   Final diagnoses:  Bilateral lumbar radiculopathy  Hyperglycemia    No neuro deficit on exam or by history to suggest emergent or surgical presentation.  Also discussed worsened sx that should prompt immediate re-evaluation including distal weakness, bowel/bladder retention/incontinence.  Patient CBG was significantly elevated when checked.  She was encouraged to stay for further lab testing and for treatment of her hyperglycemia.  She refused to stay for this.  She states that with her mother's death she has  not taken care of herself today.  She has eaten and has not taken her home medications but will do this as soon as she arrives home.  She was prescribed hydrocodone and ibuprofen.  Also suggested she can use her home diazepam for muscle relaxation if she chooses, but to use caution with this medication and her hydrocodone as they are both sedating.  Heating pad applied to her lower back for 20 minutes several times daily.  Advised return here for any worsened symptoms including nausea, vomiting abdominal pain, weakness all signs of worsening hyperglycemia.  Also encouraged followup with her PCP for recheck of her symptoms.  She states she has  an appointment already scheduled in one week.    The patient appears reasonably screened and/or stabilized for discharge and I doubt any other medical condition or other [facility] requiring further screening, evaluation, or treatment in the ED at this time prior to discharge.    Burgess Amor, PA-C 08/28/13 0140

## 2013-08-27 NOTE — ED Notes (Signed)
Pt's CBG discussed. EDPa in to see pt. Pt requesting discharge for family matter. Pt to take meds for blood sugar on arrival home.

## 2013-08-27 NOTE — ED Notes (Addendum)
Pt reports lower back pain that radiates to bilateral legs. Pt reports difficulty urinating, increased frequency. Pt denies any hematuria. Pt reports when I am able to go it feels like my bladder "isn't emptying fully." Pt reports nausea. nad noted. Pt denies any vomiting/diarrhea.

## 2013-08-27 NOTE — ED Notes (Signed)
Pt seen and evaluated by EDPa for initial assessment. 

## 2013-08-27 NOTE — Discharge Instructions (Signed)
Lumbosacral Radiculopathy Lumbosacral radiculopathy is a pinched nerve or nerves in the low back (lumbosacral area). When this happens you may have weakness in your legs and may not be able to stand on your toes. You may have pain going down into your legs. There may be difficulties with walking normally. There are many causes of this problem. Sometimes this may happen from an injury, or simply from arthritis or boney problems. It may also be caused by other illnesses such as diabetes. If there is no improvement after treatment, further studies may be done to find the exact cause. DIAGNOSIS  X-rays may be needed if the problems become Debruyn standing. Electromyograms may be done. This study is one in which the working of nerves and muscles is studied. HOME CARE INSTRUCTIONS   Applications of ice packs may be helpful. Ice can be used in a plastic bag with a towel around it to prevent frostbite to skin. This may be used every 2 hours for 20 to 30 minutes, or as needed, while awake, or as directed by your caregiver.  Only take over-the-counter or prescription medicines for pain, discomfort, or fever as directed by your caregiver.  If physical therapy was prescribed, follow your caregiver's directions. SEEK IMMEDIATE MEDICAL CARE IF:   You have pain not controlled with medications.  You seem to be getting worse rather than better.  You develop increasing weakness in your legs.  You develop loss of bowel or bladder control.  You have difficulty with walking or balance, or develop clumsiness in the use of your legs.  You have a fever. MAKE SURE YOU:   Understand these instructions.  Will watch your condition.  Will get help right away if you are not doing well or get worse. Document Released: 05/04/2005 Document Revised: 07/27/2011 Document Reviewed: 12/23/2007 [facility] Patient Information 2014 [place], [place].   Use the medications as directed.   Do not drive within 4 hours of taking  hydrocodone as this will make you drowsy.  Avoid lifting,  Bending,  Twisting or any other activity that worsens your pain over the next week.  Apply a heating pad to your lower back as discussed for 20 minutes 2-3 times daily.   You should get rechecked if your symptoms are not better over the next 5 days,  Or you develop increased pain,  Weakness in your leg(s) or loss of bladder or bowel function - these are symptoms of a worse injury.    High Blood Sugar High blood sugar (hyperglycemia) means that the level of sugar in your blood is higher than it should be. Signs of high blood sugar include:  Feeling thirsty.  Frequent peeing (urinating).  Feeling tired or sleepy.  Dry mouth.  Vision changes.  Feeling weak.  Feeling hungry but losing weight.  Numbness and tingling in your hands or feet.  Headache. When you ignore these signs, your blood sugar may keep going up. These problems may get worse, and other problems may begin. HOME CARE  Check your blood sugars as told by your doctor. Write down the numbers with the date and time.  Take the right amount of insulin or diabetes pills at the right time. Write down the dose with date and time.  Refill your insulin or diabetes pills before running out.  Watch what you eat. Follow your meal plan.  Drink liquids without sugar, such as water. Check with your doctor if you have kidney or heart disease.  Follow your doctor's orders for exercise.  Exercise at the same time of day.  Keep your doctor's appointments. GET HELP RIGHT AWAY IF:   You have trouble thinking or are confused.  You have fast breathing with fruity smelling breath.  You pass out (faint).  You have 2 to 3 days of high blood sugars and you do not know why.  You have chest pain.  You are feeling sick to your stomach (nauseous) or throwing up (vomiting).  You have sudden vision changes. MAKE SURE YOU:   Understand these instructions.  Will watch your  condition.  Will get help right away if you are not doing well or get worse. Document Released: 03/01/2009 Document Revised: 07/27/2011 Document Reviewed: 03/01/2009 [facility] Patient Information 2014 [place], [place].  It is important for you to keep a close watch on your blood glucose levels.  Return here for any worsened symptoms.

## 2013-08-31 NOTE — ED Provider Notes (Signed)
Medical screening examination/treatment/procedure(s) were performed by non-physician practitioner and as supervising physician I was immediately available for consultation/collaboration.   EKG Interpretation None       Raeford RazorStephen Danyon Mcginness, MD 08/31/13 [phone]

## 2013-09-06 ENCOUNTER — Emergency Department (HOSPITAL_COMMUNITY): Payer: Medicaid Other

## 2013-09-06 ENCOUNTER — Encounter (HOSPITAL_COMMUNITY): Payer: Self-pay | Admitting: Emergency Medicine

## 2013-09-06 ENCOUNTER — Emergency Department (HOSPITAL_COMMUNITY)
Admission: EM | Admit: 2013-09-06 | Discharge: 2013-09-06 | Disposition: A | Discharge status: Home / Self Care | Payer: Medicaid Other | Attending: Emergency Medicine | Admitting: Emergency Medicine

## 2013-09-06 DIAGNOSIS — F17200 Nicotine dependence, unspecified, uncomplicated: Secondary | ICD-10-CM | POA: Insufficient documentation

## 2013-09-06 DIAGNOSIS — E119 Type 2 diabetes mellitus without complications: Secondary | ICD-10-CM | POA: Insufficient documentation

## 2013-09-06 DIAGNOSIS — Y92009 Unspecified place in unspecified non-institutional (private) residence as the place of occurrence of the external cause: Secondary | ICD-10-CM | POA: Insufficient documentation

## 2013-09-06 DIAGNOSIS — M25449 Effusion, unspecified hand: Secondary | ICD-10-CM | POA: Insufficient documentation

## 2013-09-06 DIAGNOSIS — Y9389 Activity, other specified: Secondary | ICD-10-CM | POA: Insufficient documentation

## 2013-09-06 DIAGNOSIS — S60229A Contusion of unspecified hand, initial encounter: Secondary | ICD-10-CM

## 2013-09-06 DIAGNOSIS — W2209XA Striking against other stationary object, initial encounter: Secondary | ICD-10-CM | POA: Insufficient documentation

## 2013-09-06 DIAGNOSIS — Z79899 Other long term (current) drug therapy: Secondary | ICD-10-CM | POA: Insufficient documentation

## 2013-09-06 MED ORDER — OXYCODONE-ACETAMINOPHEN 5-325 MG PO TABS: 1.0000 | ORAL_TABLET | ORAL | Status: DC | PRN

## 2013-09-06 MED ORDER — OXYCODONE-ACETAMINOPHEN 5-325 MG PO TABS
1.0000 | ORAL_TABLET | Freq: Once | ORAL | Status: AC
  Administered 2013-09-06: 1 via ORAL
  Filled 2013-09-06: qty 1

## 2013-09-06 MED ORDER — TRAMADOL HCL 50 MG PO TABS
50.0000 mg | ORAL_TABLET | Freq: Once | ORAL | Status: AC
  Administered 2013-09-06: 50 mg via ORAL
  Filled 2013-09-06: qty 1

## 2013-09-06 NOTE — ED Notes (Signed)
Pt received discharge instructions and prescriptions, verbalized understanding and has no further questions. Pt ambulated to exit in stable condition.  Advised to return to emergency department with new or worsening symptoms.  

## 2013-09-06 NOTE — ED Provider Notes (Signed)
CSN: [id number]     Arrival date & time 09/06/13  1145 History   First MD Initiated Contact with Patient 09/06/13 1155     Chief Complaint  Patient presents with  . Hand Pain     (Consider location/radiation/quality/duration/timing/severity/associated sxs/prior Treatment) HPI Comments: Ruth [name] is a 38 y.o. Female presenting with      The history is provided by the patient.    Ruth [name] is a 38 y.o. female presenting with right hand pain and swelling after punching a wall multiple times.  She reports being upset to 2 multiple family and personal issues, primarily with the death of her mother which occurred one week ago.  She was organizing her mothers affects this morning when she became upset.  She has constant pain in her right hand along with swelling and bruising and difficulty making a fist without increased pain.  She has numbness and tingling in her right fourth finger.  Her pain is constant and worse with range of motion and palpation.  She is taking no medications for this injury prior to arrival.  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  Since her death she and her children have moved to her father's home so she has had several major life stressors.  She denies suicidal ideation.  She has not sought grief counseling for her loss.       Past Medical History  Diagnosis Date  . Diabetes mellitus without complication    Past Surgical History  Procedure Laterality Date  . Cholecystectomy    . Tubual ligation     History reviewed. No pertinent family history. History  Substance Use Topics  . Smoking status: Light Tobacco Smoker -- 0.50 packs/day  . Smokeless tobacco: Not on file  . Alcohol Use: No   OB History   Grav Para Term Preterm Abortions TAB SAB Ect Mult Living                 Review of Systems  Constitutional: Negative for fever.  Musculoskeletal: Positive for arthralgias and joint  swelling. Negative for myalgias.  Neurological: Negative for weakness and numbness.      Allergies  Review of patient's allergies indicates no known allergies.  Home Medications   Prior to Admission medications   Medication Sig Start Date End Date Taking? Authorizing Provider  diazepam (VALIUM) 5 MG tablet Take 2.5 mg by mouth every 8 (eight) hours as needed for anxiety.   Yes Historical Provider, MD  diphenhydrAMINE (BENADRYL) 25 MG tablet Take 25-50 mg by mouth daily as needed for allergies.   Yes Historical Provider, MD  ibuprofen (ADVIL,MOTRIN) 600 MG tablet Take 1 tablet (600 mg total) by mouth every 6 (six) hours as needed. 08/27/13  Yes Raynelle FanningJulie Bernedette Auston, PA-C  lisinopril (PRINIVIL,ZESTRIL) 10 MG tablet Take 10 mg by mouth daily.   Yes Historical Provider, MD  sitaGLIPtin-metformin (JANUMET) 50-1000 MG per tablet Take 1 tablet by mouth 2 (two) times daily with a meal.   Yes Historical Provider, MD  oxyCODONE-acetaminophen (PERCOCET/ROXICET) 5-325 MG per tablet Take 1 tablet by mouth every 4 (four) hours as needed for severe pain. 09/06/13   Burgess AmorJulie Kasia Trego, PA-C   BP 126/83  Pulse 108  Temp(Src) 98 F (36.7 C) (Oral)  Resp 20  SpO2 98%  LMP 08/17/2012 Physical Exam  Constitutional: She appears well-developed and well-nourished.  HENT:  Head: Atraumatic.  Neck: Normal range of motion.  Cardiovascular:  Pulses equal bilaterally  Musculoskeletal: She exhibits edema and tenderness.       Right hand: She exhibits decreased range of motion, tenderness, bony tenderness and swelling. She exhibits normal capillary refill and no deformity. Decreased sensation noted. Decreased strength noted. She exhibits finger abduction. She exhibits no wrist extension trouble.  Patient has moderate edema and ecchymosis across her third fourth and fifth MCPs of her right hand dorsally.  She displays range of motion of her fingers although has difficulty making a complete fist including her fourth and fifth  fingers.  She reports tingling in her entire fourth finger.  There is less than 2 second cap refill in her fingertips.  Radial pulses intact.  She displays full range of extension and flexion of her wrist with no difficulty with forearm pronation and supination.  No elbow pain.  Neurological: She is alert. She has normal strength. She displays normal reflexes. No sensory deficit.  Skin: Skin is warm and dry.  Psychiatric: She has a normal mood and affect.    ED Course  Procedures (including critical care time) Labs Review Labs Reviewed - No data to display  Imaging Review Dg Hand Complete Right  09/06/2013   CLINICAL DATA:  Trauma, hand pain and swelling  EXAM: RIGHT HAND - COMPLETE 3+ VIEW  COMPARISON:  None.  FINDINGS: There is no evidence of fracture or dislocation. There is no evidence of arthropathy or other focal bone abnormality. Mild soft tissue swelling over the metacarpals.  IMPRESSION: No acute osseous finding.   Electronically Signed   By: Ruel Favorsrevor  Shick M.D.   On: 09/06/2013 12:24     EKG Interpretation None      MDM   Final diagnoses:  Hand contusion    Patients labs and/or radiological studies were viewed and considered during the medical decision making and disposition process.  X-rays reviewed and shared with patient.  She was encouraged using ice as much as possible for the next 2 days, elevation, compression which was assisted with a Lenora BoysWatson Jones dressing.  She was prescribed ibuprofen and oxycodone for pain relief.  Of note, she was seen here last week for acute lumbar at which time she was prescribed hydrocodone which she states made her extremely nauseated.  She was encouraged followup with an orthopedist if her hand pain is not improved over the next 7-10 days.  Referral given for this.  Also discussed the bereavement counseling.  Suggested hospice care or her mothers funeral homes services for this.  She states that she had checked into this but had not followed up  but will consider this in the near future.   Burgess AmorJulie Dyneisha Murchison, PA-C 09/06/13 1559

## 2013-09-06 NOTE — Discharge Instructions (Signed)
Hand Contusion A hand contusion is a deep bruise on your hand area. Contusions are the result of an injury that caused bleeding under the skin. The contusion may turn blue, purple, or yellow. Minor injuries will give you a painless contusion, but more severe contusions may stay painful and swollen for a few weeks. CAUSES  A contusion is usually caused by a blow, trauma, or direct force to an area of the body. SYMPTOMS   Swelling and redness of the injured area.  Discoloration of the injured area.  Tenderness and soreness of the injured area.  Pain. DIAGNOSIS  The diagnosis can be made by taking a history and performing a physical exam. An X-ray, CT scan, or MRI may be needed to determine if there were any associated injuries, such as broken bones (fractures). TREATMENT  Often, the best treatment for a hand contusion is resting, elevating, icing, and applying cold compresses to the injured area. Over-the-counter medicines may also be recommended for pain control. HOME CARE INSTRUCTIONS   Put ice on the injured area.  Put ice in a plastic bag.  Place a towel between your skin and the bag.  Leave the ice on for 15-20 minutes, 03-04 times a day.  Only take over-the-counter or prescription medicines as directed by your caregiver. Your caregiver may recommend avoiding anti-inflammatory medicines (aspirin, ibuprofen, and naproxen) for 48 hours because these medicines may increase bruising.  If told, use an elastic wrap as directed. This can help reduce swelling. You may remove the wrap for sleeping, showering, and bathing. If your fingers become numb, cold, or blue, take the wrap off and reapply it more loosely.  Elevate your hand with pillows to reduce swelling.  Avoid overusing your hand if it is painful. SEEK IMMEDIATE MEDICAL CARE IF:   You have increased redness, swelling, or pain in your hand.  Your swelling or pain is not relieved with medicines.  You have loss of feeling in  your hand or are unable to move your fingers.  Your hand turns cold or blue.  You have pain when you move your fingers.  Your hand becomes warm to the touch.  Your contusion does not improve in 2 days. MAKE SURE YOU:   Understand these instructions.  Will watch your condition.  Will get help right away if you are not doing well or get worse. Document Released: 10/24/2001 Document Revised: 01/27/2012 Document Reviewed: 10/26/2011 [facility] Patient Information 2014 [place], [place].   You may take the oxycodone prescribed for pain relief.  This will make you drowsy - do not drive within 4 hours of taking this medication.

## 2013-09-06 NOTE — ED Notes (Signed)
Pt c/o right hand. Pt states she was upset this morning and punched a wall multiple times.

## 2013-09-06 NOTE — ED Notes (Signed)
Pt's friend to drive her home.

## 2013-09-07 NOTE — ED Provider Notes (Signed)
  Medical screening examination/treatment/procedure(s) were performed by non-physician practitioner and as supervising physician I was immediately available for consultation/collaboration.   EKG Interpretation None           Gerhard Munchobert Mistie Adney, MD 09/07/13 [phone]

## 2014-01-25 ENCOUNTER — Emergency Department (HOSPITAL_COMMUNITY)
Admission: EM | Admit: 2014-01-25 | Discharge: 2014-01-25 | Disposition: A | Discharge status: Home / Self Care | Payer: Medicaid Other | Attending: Emergency Medicine | Admitting: Emergency Medicine

## 2014-01-25 ENCOUNTER — Encounter (HOSPITAL_COMMUNITY): Payer: Self-pay | Admitting: Emergency Medicine

## 2014-01-25 DIAGNOSIS — Z79899 Other long term (current) drug therapy: Secondary | ICD-10-CM | POA: Insufficient documentation

## 2014-01-25 DIAGNOSIS — IMO0002 Reserved for concepts with insufficient information to code with codable children: Secondary | ICD-10-CM | POA: Insufficient documentation

## 2014-01-25 DIAGNOSIS — F17200 Nicotine dependence, unspecified, uncomplicated: Secondary | ICD-10-CM | POA: Insufficient documentation

## 2014-01-25 DIAGNOSIS — S336XXA Sprain of sacroiliac joint, initial encounter: Secondary | ICD-10-CM | POA: Insufficient documentation

## 2014-01-25 DIAGNOSIS — M1990 Unspecified osteoarthritis, unspecified site: Secondary | ICD-10-CM | POA: Insufficient documentation

## 2014-01-25 DIAGNOSIS — Y929 Unspecified place or not applicable: Secondary | ICD-10-CM | POA: Insufficient documentation

## 2014-01-25 DIAGNOSIS — R110 Nausea: Secondary | ICD-10-CM | POA: Insufficient documentation

## 2014-01-25 DIAGNOSIS — X500XXA Overexertion from strenuous movement or load, initial encounter: Secondary | ICD-10-CM | POA: Insufficient documentation

## 2014-01-25 DIAGNOSIS — Z791 Long term (current) use of non-steroidal anti-inflammatories (NSAID): Secondary | ICD-10-CM | POA: Diagnosis not present

## 2014-01-25 DIAGNOSIS — S39012A Strain of muscle, fascia and tendon of lower back, initial encounter: Secondary | ICD-10-CM

## 2014-01-25 DIAGNOSIS — Y9389 Activity, other specified: Secondary | ICD-10-CM | POA: Diagnosis not present

## 2014-01-25 DIAGNOSIS — E119 Type 2 diabetes mellitus without complications: Secondary | ICD-10-CM | POA: Diagnosis not present

## 2014-01-25 HISTORY — DX: Unspecified osteoarthritis, unspecified site: M19.90

## 2014-01-25 HISTORY — DX: Other shoulder lesions, unspecified shoulder: M75.80

## 2014-01-25 MED ORDER — OXYCODONE-ACETAMINOPHEN 5-325 MG PO TABS
1.0000 | ORAL_TABLET | ORAL | Status: DC | PRN
Start: 2014-01-25 — End: 2014-03-06

## 2014-01-25 MED ORDER — IBUPROFEN 600 MG PO TABS: 600.0000 mg | ORAL_TABLET | Freq: Three times a day (TID) | ORAL | Status: DC

## 2014-01-25 MED ORDER — OXYCODONE-ACETAMINOPHEN 5-325 MG PO TABS
1.0000 | ORAL_TABLET | Freq: Once | ORAL | Status: AC
  Administered 2014-01-25: 1 via ORAL
  Filled 2014-01-25: qty 1

## 2014-01-25 MED ORDER — KETOROLAC TROMETHAMINE 60 MG/2ML IM SOLN
60.0000 mg | Freq: Once | INTRAMUSCULAR | Status: AC
  Administered 2014-01-25: 60 mg via INTRAMUSCULAR
  Filled 2014-01-25: qty 2

## 2014-01-25 NOTE — ED Notes (Signed)
PT c/o lower back pain worsening x3 days after moving some furniture. PT c/o pain shooting down both legs.

## 2014-01-25 NOTE — ED Provider Notes (Signed)
CSN: [id number]     Arrival date & time 01/25/14  6213 History   First MD Initiated Contact with Patient 01/25/14 [phone]     Chief Complaint  Patient presents with  . Back Pain     (Consider location/radiation/quality/duration/timing/severity/associated sxs/prior Treatment) The history is provided by the patient.   Ruth [name] is a 38 y.o. female has occasional problems with pain in her lower back secondary to degenerative joint and disc disease presenting with a three-day history of low back pain which shoots down her bilateral legs with certain positions.  Her symptoms started after helping to move some furniture 4 days ago.  She denies weakness in her lower extremities and has had no urinary or bowel incontinence or retention.  She has taken both ibuprofen and Tylenol without relief of symptoms.  Her ex-husband gave her hydrocodone which improved her pain but made her very nauseated.  She has contacted her PCP regarding this problem and has an appointment scheduled for next week.     Past Medical History  Diagnosis Date  . Diabetes mellitus without complication   . DJD (degenerative joint disease)   . AC (acromioclavicular) joint bone spurs    Past Surgical History  Procedure Laterality Date  . Cholecystectomy    . Tubual ligation    . Tubal ligation     No family history on file. History  Substance Use Topics  . Smoking status: Light Tobacco Smoker -- 0.50 packs/day    Types: Cigarettes  . Smokeless tobacco: Not on file  . Alcohol Use: No   OB History   Grav Para Term Preterm Abortions TAB SAB Ect Mult Living                 Review of Systems  Constitutional: Negative for fever.  Respiratory: Negative for shortness of breath.   Cardiovascular: Negative for chest pain and leg swelling.  Gastrointestinal: Negative for abdominal pain, constipation and abdominal distention.  Genitourinary: Negative for dysuria, urgency, frequency, flank pain and difficulty urinating.   Musculoskeletal: Positive for back pain. Negative for gait problem and joint swelling.  Skin: Negative for rash.  Neurological: Negative for weakness and numbness.      Allergies  Review of patient's allergies indicates no known allergies.  Home Medications   Prior to Admission medications   Medication Sig Start Date End Date Taking? Authorizing Provider  diazepam (VALIUM) 5 MG tablet Take 2.5 mg by mouth every 8 (eight) hours as needed for anxiety.    Historical Provider, MD  diphenhydrAMINE (BENADRYL) 25 MG tablet Take 25-50 mg by mouth daily as needed for allergies.    Historical Provider, MD  ibuprofen (ADVIL,MOTRIN) 600 MG tablet Take 1 tablet (600 mg total) by mouth every 6 (six) hours as needed. 08/27/13   Burgess Amor, PA-C  ibuprofen (ADVIL,MOTRIN) 600 MG tablet Take 1 tablet (600 mg total) by mouth 3 (three) times daily. 01/25/14   Burgess Amor, PA-C  lisinopril (PRINIVIL,ZESTRIL) 10 MG tablet Take 10 mg by mouth daily.    Historical Provider, MD  oxyCODONE-acetaminophen (PERCOCET/ROXICET) 5-325 MG per tablet Take 1 tablet by mouth every 4 (four) hours as needed for severe pain. 09/06/13   Burgess Amor, PA-C  oxyCODONE-acetaminophen (PERCOCET/ROXICET) 5-325 MG per tablet Take 1 tablet by mouth every 4 (four) hours as needed. 01/25/14   Burgess Amor, PA-C  sitaGLIPtin-metformin (JANUMET) 50-1000 MG per tablet Take 1 tablet by mouth 2 (two) times daily with a meal.    Historical Provider, MD  BP 138/100  Pulse 121  Temp(Src) 98.2 F (36.8 C) (Oral)  Resp 18  Ht  (1.626 m)  Wt 200 lb (90.719 kg)  BMI 34.31 kg/m2  SpO2 96% Physical Exam  Nursing note and vitals reviewed. Constitutional: She appears well-developed and well-nourished.  HENT:  Head: Normocephalic.  Eyes: Conjunctivae are normal.  Neck: Normal range of motion. Neck supple.  Cardiovascular: Normal rate and intact distal pulses.   Pedal pulses normal.  Pulmonary/Chest: Effort normal.  Abdominal: Soft. Bowel  sounds are normal. She exhibits no distension and no mass.  Musculoskeletal: Normal range of motion. She exhibits no edema.       Lumbar back: She exhibits tenderness. She exhibits no swelling, no edema and no spasm.  Lumbar midline and bilateral paralumbar tenderness to palpation.  Neurological: She is alert. She has normal strength. She displays no atrophy and no tremor. No sensory deficit. Gait normal.  Reflex Scores:      Patellar reflexes are 2+ on the right side and 2+ on the left side.      Achilles reflexes are 2+ on the right side and 2+ on the left side. No strength deficit noted in hip and knee flexor and extensor muscle groups.  Ankle flexion and extension intact.  Skin: Skin is warm and dry.  Psychiatric: She has a normal mood and affect.    ED Course  Procedures (including critical care time) Labs Review Labs Reviewed - No data to display  Imaging Review No results found.   EKG Interpretation None      MDM   Final diagnoses:  Low back strain, initial encounter    No neuro deficit on exam or by history to suggest emergent or surgical presentation.  Also discussed worsened sx that should prompt immediate re-evaluation including distal weakness, bowel/bladder retention/incontinence.  She was prescribed ibuprofen and oxycodone.  Encouraged activity as tolerated.  Heat therapy to lower back 3-4 times daily.  Followup with PCP next week as planned.        Burgess Amor, PA-C 01/25/14 [phone]

## 2014-01-25 NOTE — Discharge Instructions (Signed)
Back Pain, Adult °Low back pain is very common. About 1 in 5 people have back pain. The cause of low back pain is rarely dangerous. The pain often gets better over time. About half of people with a sudden onset of back pain feel better in just 2 weeks. About 8 in 10 people feel better by 6 weeks.  °CAUSES °Some common causes of back pain include: °· Strain of the muscles or ligaments supporting the spine. °· Wear and tear (degeneration) of the spinal discs. °· Arthritis. °· Direct injury to the back. °DIAGNOSIS °Most of the time, the direct cause of low back pain is not known. However, back pain can be treated effectively even when the exact cause of the pain is unknown. Answering your caregiver's questions about your overall health and symptoms is one of the most accurate ways to make sure the cause of your pain is not dangerous. If your caregiver needs more information, he or she may order lab work or imaging tests (X-rays or MRIs). However, even if imaging tests show changes in your back, this usually does not require surgery. °HOME CARE INSTRUCTIONS °For many people, back pain returns. Since low back pain is rarely dangerous, it is often a condition that people can learn to manage on their own.  °· Remain active. It is stressful on the back to sit or stand in one place. Do not sit, drive, or stand in one place for more than 30 minutes at a time. Take short walks on level surfaces as soon as pain allows. Try to increase the length of time you walk each day. °· Do not stay in bed. Resting more than 1 or 2 days can delay your recovery. °· Do not avoid exercise or work. Your body is made to move. It is not dangerous to be active, even though your back may hurt. Your back will likely heal faster if you return to being active before your pain is gone. °· Pay attention to your body when you  bend and lift. Many people have less discomfort when lifting if they bend their knees, keep the load close to their bodies, and  avoid twisting. Often, the most comfortable positions are those that put less stress on your recovering back. °· Find a comfortable position to sleep. Use a firm mattress and lie on your side with your knees slightly bent. If you lie on your back, put a pillow under your knees. °· Only take over-the-counter or prescription medicines as directed by your caregiver. Over-the-counter medicines to reduce pain and inflammation are often the most helpful. Your caregiver may prescribe muscle relaxant drugs. These medicines help dull your pain so you can more quickly return to your normal activities and healthy exercise. °· Put ice on the injured area. °¨ Put ice in a plastic bag. °¨ Place a towel between your skin and the bag. °¨ Leave the ice on for 15-20 minutes, 03-04 times a day for the first 2 to 3 days. After that, ice and heat may be alternated to reduce pain and spasms. °· Ask your caregiver about trying back exercises and gentle massage. This may be of some benefit. °· Avoid feeling anxious or stressed. Stress increases muscle tension and can worsen back pain. It is important to recognize when you are anxious or stressed and learn ways to manage it. Exercise is a great option. °SEEK MEDICAL CARE IF: °· You have pain that is not relieved with rest or medicine. °· You have pain that does not improve in 1 week. °· You have new symptoms. °· You are generally not feeling well. °SEEK   IMMEDIATE MEDICAL CARE IF:   You have pain that radiates from your back into your legs.  You develop new bowel or bladder control problems.  You have unusual weakness or numbness in your arms or legs.  You develop nausea or vomiting.  You develop abdominal pain.  You feel faint. Document Released: 05/04/2005 Document Revised: 11/03/2011 Document Reviewed: 09/05/2013 [facility] Patient Information 2015 [place], [place]. This information is not intended to replace advice given to you by your health care provider. Make sure you  discuss any questions you have with your health care provider.   Do not drive within 4 hours of taking oxycodone as this will make you drowsy.  Avoid lifting,  Bending,  Twisting or any other activity that worsens your pain over the next week.  Apply a heating pad to your lower back for 20 minutes 3-4 times daily.  Get your symptoms rechecked if they are not better over the next 5 days,  Or you develop increased pain,  Weakness in your leg(s) or loss of bladder or bowel function - these are symptoms of a worse injury.

## 2014-01-25 NOTE — ED Provider Notes (Signed)
Medical screening examination/treatment/procedure(s) were performed by non-physician practitioner and as supervising physician I was immediately available for consultation/collaboration.   EKG Interpretation None       Donnetta Hutching, MD 01/25/14 1252

## 2014-02-12 ENCOUNTER — Ambulatory Visit (INDEPENDENT_AMBULATORY_CARE_PROVIDER_SITE_OTHER): Payer: 59 | Admitting: Psychology

## 2014-02-12 ENCOUNTER — Encounter (HOSPITAL_COMMUNITY): Payer: Self-pay | Admitting: Psychology

## 2014-02-12 DIAGNOSIS — F4001 Agoraphobia with panic disorder: Secondary | ICD-10-CM

## 2014-02-12 DIAGNOSIS — F411 Generalized anxiety disorder: Secondary | ICD-10-CM

## 2014-02-12 DIAGNOSIS — F331 Major depressive disorder, recurrent, moderate: Secondary | ICD-10-CM
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
## 2014-02-27 ENCOUNTER — Ambulatory Visit (HOSPITAL_COMMUNITY): Payer: Self-pay | Admitting: Psychology

## 2014-03-05 ENCOUNTER — Encounter (HOSPITAL_COMMUNITY): Payer: Self-pay | Admitting: Psychology

## 2014-03-05 ENCOUNTER — Ambulatory Visit (INDEPENDENT_AMBULATORY_CARE_PROVIDER_SITE_OTHER): Payer: MEDICAID | Admitting: Psychology

## 2014-03-05 DIAGNOSIS — F331 Major depressive disorder, recurrent, moderate: Secondary | ICD-10-CM

## 2014-03-05 DIAGNOSIS — F411 Generalized anxiety disorder: Secondary | ICD-10-CM

## 2014-03-05 DIAGNOSIS — F4001 Agoraphobia with panic disorder: Secondary | ICD-10-CM

## 2014-03-05 NOTE — Progress Notes (Signed)
PROGRESS NOTE  Patient:  Ruth [name]   DOB: 05/23/1975  MR Number: [id number]  [facility] [address] [place] [address] Dept: [phone]  Start: 9 AM End: 9:59 AM  Provider/Observer:     Hershal CoriaJohn R Dalen [place]ennessee PSYD  Chief Complaint:      Chief Complaint  Patient presents with  . Anxiety  . Depression  . Panic Attack    Reason For Service:    The patient was referred by [facility] family medicine because of significant issues of extreme/severe anxiety and depression. The patient also reports events consistent with panic attacks that include feeling like she was going to have a heart attack and died the patient reports his major recent stressors have to do with the death of her mother 5 months ago and strained relationships with her father.  The patient reports that she is always had significant anxiety going back to elementary school. She reports a these symptoms have been debilitating. The patient reports that she has difficulty focusing and getting out of bed. The patient reports that she has trouble leaving her house and going in public. The patient reports that she has always been very shy and anxious. She reports that while she and her mother had difficulty and the patient was a teenager they became close as adults and the patient took care of her mother and she was dying from effects of diabetes. The patient's mother gangrene in one of her feet and refused to have her foot at. The patient reports she watched her mother die.  The patient describes difficult symptoms related to severe insomnia. She reports that she's had a number of attempts with psychotropic medications including Ambien and and while it helped her fall asleep she would still not stay asleep.  Psychosocial stressors that have developed recently are due to the fact that her father has started a relationship with  another woman very recently. This other woman purchased the house next door to the patient and her father. The father has been trying to get the patient and her children to develop a relationship with this woman but the patient is very upset by this.   Interventions Strategy:  Cognitive/behavioral psychotherapeutic interventions  Participation Level:   Active  Participation Quality:  Appropriate      Behavioral Observation:  Well Groomed, Alert, and Appropriate.   Current Psychosocial Factors: The patient reports there continues to be a lot of stress for the patient. This is particularly true have to do with the relationship between her and her father did to her father's new relationship. She reports that she is having great deal of difficulty forgiven her father for how he has been since her mother passed away. The patient reports that she is still avoiding going out in public.  Content of Session:   Review current symptoms and worked on cognitive behavioral interventions and today was a special focus on issues related to her panic attacks and strategies to reduce the impact of these symptoms.  Current Status:   The patient reports that she is still having issues related to panic attacks and anxiety and her anger and avoidance continue to be paramount.  Patient Progress:   The patient is still struggling with the loss of her mother and engaging in activities that likely exacerbate the symptoms.  Target Goals:   Target goal include reducing intensity, severity, duration of issues related to her panic disorder and  anxiety as well as more [place]-term issues related to her depression. The patient is still experiencing a great deal of anger around issues associated with her mother passing.  Last Reviewed:   03/05/2014  Goals Addressed Today:    Goals addressed today has to do with teaching her relaxation techniques and other issues associated with the treatment of panic disorder as well as working  on issues related to her anger and guilt associated with her mother die.  Impression/Diagnosis:  the patient has a [place] history of significant anxiety and depression and recently lost her mother which exacerbated his [place]-standing issue she also describes symptoms consistent with panic disorder with agoraphobia. She is essentially isolating herself in her bedroom. The patient is experiencing severe insomnia. Current stressors have to do with the fact that her father started a relationship with a woman that essentially bought a house next door to them right after her mother passed away.   Diagnosis:    Axis I: Generalized anxiety disorder  Panic disorder with agoraphobia  Major depressive disorder, recurrent episode, moderate        Chaun Uemura R, PsyD 03/05/2014

## 2014-03-06 ENCOUNTER — Encounter (HOSPITAL_COMMUNITY): Payer: Self-pay | Admitting: Psychiatry

## 2014-03-06 ENCOUNTER — Ambulatory Visit (INDEPENDENT_AMBULATORY_CARE_PROVIDER_SITE_OTHER): Payer: MEDICAID | Admitting: Psychiatry

## 2014-03-06 VITALS — BP 144/101 | HR 98 | Ht 64.0 in | Wt 186.6 lb

## 2014-03-06 DIAGNOSIS — F411 Generalized anxiety disorder: Secondary | ICD-10-CM | POA: Insufficient documentation

## 2014-03-06 DIAGNOSIS — F900 Attention-deficit hyperactivity disorder, predominantly inattentive type: Secondary | ICD-10-CM | POA: Insufficient documentation

## 2014-03-06 MED ORDER — AMPHETAMINE-DEXTROAMPHET ER 15 MG PO CP24: 15.0000 mg | ORAL_CAPSULE | ORAL | Status: DC

## 2014-03-06 MED ORDER — DIAZEPAM 10 MG PO TABS: 10.0000 mg | ORAL_TABLET | Freq: Every day | ORAL | Status: DC
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
## 2014-03-19 ENCOUNTER — Ambulatory Visit (INDEPENDENT_AMBULATORY_CARE_PROVIDER_SITE_OTHER): Payer: MEDICAID | Admitting: Psychology

## 2014-03-19 DIAGNOSIS — F4001 Agoraphobia with panic disorder: Secondary | ICD-10-CM

## 2014-03-19 DIAGNOSIS — F411 Generalized anxiety disorder: Secondary | ICD-10-CM

## 2014-04-05 ENCOUNTER — Encounter (HOSPITAL_COMMUNITY): Payer: Self-pay | Admitting: Psychiatry

## 2014-04-05 ENCOUNTER — Ambulatory Visit (INDEPENDENT_AMBULATORY_CARE_PROVIDER_SITE_OTHER): Payer: MEDICAID | Admitting: Psychiatry

## 2014-04-05 VITALS — BP 128/109 | HR 113 | Ht 64.0 in | Wt 188.0 lb

## 2014-04-05 DIAGNOSIS — F900 Attention-deficit hyperactivity disorder, predominantly inattentive type: Secondary | ICD-10-CM

## 2014-04-05 DIAGNOSIS — F411 Generalized anxiety disorder: Secondary | ICD-10-CM

## 2014-04-05 MED ORDER — AMPHETAMINE-DEXTROAMPHET ER 20 MG PO CP24: 20.0000 mg | ORAL_CAPSULE | Freq: Every day | ORAL | Status: DC
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
## 2014-04-06 ENCOUNTER — Ambulatory Visit (HOSPITAL_COMMUNITY): Payer: Self-pay | Admitting: Psychiatry

## 2014-04-09 ENCOUNTER — Ambulatory Visit (INDEPENDENT_AMBULATORY_CARE_PROVIDER_SITE_OTHER): Payer: MEDICAID | Admitting: Psychology

## 2014-04-09 DIAGNOSIS — F411 Generalized anxiety disorder: Secondary | ICD-10-CM

## 2014-04-09 DIAGNOSIS — F4001 Agoraphobia with panic disorder: Secondary | ICD-10-CM

## 2014-04-10 ENCOUNTER — Encounter (HOSPITAL_COMMUNITY): Payer: Self-pay | Admitting: Psychology

## 2014-04-10 NOTE — Progress Notes (Signed)
PROGRESS NOTE  Patient:  Ruth [name]   DOB: 11-22-1975  MR Number: [id number]  [facility] [address] [place] [address] Dept: [phone]  Start: 9 AM End: 10 AM  Provider/Observer:     Hershal CoriaJohn R Bauer Ausborn PSYD  Chief Complaint:      Chief Complaint  Patient presents with  . Anxiety    Reason For Service:    The [facility] because of significant issues of extreme/severe anxiety and depression. The patient also reports events consistent with panic attacks that include feeling like she was going to have a heart attack and died the patient reports his major recent stressors have to do with the death of her mother 5 months ago and strained relationships with her father.  The patient reports that she is always had significant anxiety going back to elementary school. She reports a these symptoms have been debilitating. The patient reports that she has difficulty focusing and getting out of bed. The patient reports that she has trouble leaving her house and going in public. The patient reports that she has always been very shy and anxious. She reports that while she and her mother had difficulty and the patient was a teenager they became close as adults and the patient took care of her mother and she was dying from effects of diabetes. The patient's mother gangrene in one of her feet and refused to have her foot at. The patient reports she watched her mother die.  The patient describes difficult symptoms related to severe insomnia. She reports that she's had a number of attempts with psychotropic medications including Ambien and and while it helped her fall asleep she would still not stay asleep.  Psychosocial stressors that have developed recently are due to the fact that her father has started a relationship with another woman very recently.  This other woman purchased the house next door to the patient and her father. The father has been trying to get the patient and her children to develop a relationship with this woman but the patient is very upset by this.   Interventions Strategy:  Cognitive/behavioral psychotherapeutic interventions  Participation Level:   Active  Participation Quality:  Appropriate      Behavioral Observation:  Well Groomed, Alert, and Appropriate.   Current Psychosocial Factors: The patient comes in today along with her father reporting that she has made strides to try to have some relationship with a woman that lives next door that had been dating her father. The patient reports that she has a lot of stress around the conflicts between her and her brother. Her father reports that he is concerned that she is "too kindhearted" and regularly as people take advantage of her because of this issue.  Content of Session:   Review current symptoms and worked on cognitive behavioral interventions and today was a special focus on issues related to her panic attacks and strategies to reduce the impact of these symptoms.  Current Status:   The patient reports that she is still having issues related to panic attacks and anxiety but her anxiety symptoms and depression have improved. She still having trouble coping with the bereavement issues associated with the death of her mother.  Patient Progress:   The patient is still struggling with the loss of her mother and engaging in activities that likely exacerbate the symptoms.  Target Goals:  Target goal include reducing intensity, severity, duration of issues related to her panic disorder and anxiety as well as more Mccoll-term issues related to her depression. The patient is still experiencing a great deal of anger around issues associated with her mother passing.  Last Reviewed:   04/09/2014  Goals Addressed Today:    Goals addressed today has to do with teaching her  relaxation techniques and other issues associated with the treatment of panic disorder as well as working on issues related to her anger and guilt associated with her mother die.  Impression/Diagnosis:  the patient has a Pile history of significant anxiety and depression and recently lost her mother which exacerbated his Poitra-standing issue she also describes symptoms consistent with panic disorder with agoraphobia. She is essentially isolating herself in her bedroom. The patient is experiencing severe insomnia. Current stressors have to do with the fact that her father started a relationship with a woman that essentially bought a house next door to them right after her mother passed away.   Diagnosis:    Axis I: Generalized anxiety disorder  Panic disorder with agoraphobia        Ruth [name] R, PsyD 04/10/2014

## 2014-04-18 ENCOUNTER — Emergency Department (HOSPITAL_COMMUNITY): Payer: Medicaid Other

## 2014-04-18 ENCOUNTER — Encounter (HOSPITAL_COMMUNITY): Payer: Self-pay | Admitting: Emergency Medicine

## 2014-04-18 ENCOUNTER — Emergency Department (HOSPITAL_COMMUNITY)
Admission: EM | Admit: 2014-04-18 | Discharge: 2014-04-18 | Disposition: A | Discharge status: Home / Self Care | Payer: Medicaid Other | Attending: Emergency Medicine | Admitting: Emergency Medicine

## 2014-04-18 DIAGNOSIS — F909 Attention-deficit hyperactivity disorder, unspecified type: Secondary | ICD-10-CM | POA: Insufficient documentation

## 2014-04-18 DIAGNOSIS — Y9389 Activity, other specified: Secondary | ICD-10-CM | POA: Insufficient documentation

## 2014-04-18 DIAGNOSIS — Y998 Other external cause status: Secondary | ICD-10-CM | POA: Diagnosis not present

## 2014-04-18 DIAGNOSIS — F329 Major depressive disorder, single episode, unspecified: Secondary | ICD-10-CM | POA: Insufficient documentation

## 2014-04-18 DIAGNOSIS — Z79899 Other long term (current) drug therapy: Secondary | ICD-10-CM | POA: Diagnosis not present

## 2014-04-18 DIAGNOSIS — M25532 Pain in left wrist: Secondary | ICD-10-CM

## 2014-04-18 DIAGNOSIS — W01198A Fall on same level from slipping, tripping and stumbling with subsequent striking against other object, initial encounter: Secondary | ICD-10-CM | POA: Insufficient documentation

## 2014-04-18 DIAGNOSIS — F419 Anxiety disorder, unspecified: Secondary | ICD-10-CM | POA: Insufficient documentation

## 2014-04-18 DIAGNOSIS — Z8739 Personal history of other diseases of the musculoskeletal system and connective tissue: Secondary | ICD-10-CM | POA: Diagnosis not present

## 2014-04-18 DIAGNOSIS — S6992XA Unspecified injury of left wrist, hand and finger(s), initial encounter: Secondary | ICD-10-CM | POA: Diagnosis not present

## 2014-04-18 DIAGNOSIS — E119 Type 2 diabetes mellitus without complications: Secondary | ICD-10-CM | POA: Insufficient documentation

## 2014-04-18 DIAGNOSIS — Y92092 Bedroom in other non-institutional residence as the place of occurrence of the external cause: Secondary | ICD-10-CM | POA: Diagnosis not present

## 2014-04-18 DIAGNOSIS — Z87891 Personal history of nicotine dependence: Secondary | ICD-10-CM | POA: Insufficient documentation

## 2014-04-18 DIAGNOSIS — M25539 Pain in unspecified wrist: Secondary | ICD-10-CM

## 2014-04-18 MED ORDER — NAPROXEN 500 MG PO TABS: 500.0000 mg | ORAL_TABLET | Freq: Two times a day (BID) | ORAL | Status: DC

## 2014-04-18 MED ORDER — OXYCODONE-ACETAMINOPHEN 5-325 MG PO TABS
1.0000 | ORAL_TABLET | Freq: Once | ORAL | Status: AC
  Administered 2014-04-18: 1 via ORAL
  Filled 2014-04-18: qty 1

## 2014-04-18 MED ORDER — IBUPROFEN 400 MG PO TABS
600.0000 mg | ORAL_TABLET | Freq: Once | ORAL | Status: AC
  Administered 2014-04-18: 600 mg via ORAL
  Filled 2014-04-18: qty 2

## 2014-04-18 MED ORDER — IBUPROFEN 400 MG PO TABS
ORAL_TABLET | ORAL | Status: AC
  Filled 2014-04-18: qty 1

## 2014-04-18 NOTE — ED Provider Notes (Signed)
CSN: [id number]     Arrival date & time 04/18/14  0052 History   First MD Initiated Contact with Patient 04/18/14 0054     Chief Complaint  Patient presents with  . Wrist Pain      HPI Patient reports falling this evening in her bedroom and striking the posterior distal portion of her left wrist on the bed frame.  She then fell to the ground on her left wrist.  She reports severe pain in her posterior left wrist.  She can flex and extend at the left wrist.  She has noticeable swelling of the distal posterior left wrist.  Patient reports no other injury or pain.  Denies head injury.  No neck pain.  Pain is moderate in severity and worse with palpation of her left wrist.   Past Medical History  Diagnosis Date  . Diabetes mellitus without complication   . DJD (degenerative joint disease)   . AC (acromioclavicular) joint bone spurs   . ADHD (attention deficit hyperactivity disorder)   . Anxiety   . Depression    Past Surgical History  Procedure Laterality Date  . Cholecystectomy    . Tubual ligation    . Tubal ligation     Family History  Problem Relation Age of Onset  . Bipolar disorder Mother   . Drug abuse Brother   . ADD / ADHD Son    History  Substance Use Topics  . Smoking status: Former Smoker -- 0.50 packs/day    Types: Cigarettes  . Smokeless tobacco: Not on file  . Alcohol Use: No   OB History    No data available     Review of Systems  All other systems reviewed and are negative.     Allergies  Review of patient's allergies indicates no known allergies.  Home Medications   Prior to Admission medications   Medication Sig Start Date End Date Taking? Authorizing Provider  amphetamine-dextroamphetamine (ADDERALL XR) 20 MG 24 hr capsule Take 1 capsule (20 mg total) by mouth daily. 04/05/14   Diannia Rudereborah Ross, MD  amphetamine-dextroamphetamine (ADDERALL XR) 20 MG 24 hr capsule Take 1 capsule (20 mg total) by mouth daily. 04/05/14   Diannia Rudereborah Ross, MD  diazepam  (VALIUM) 10 MG tablet Take 1 tablet (10 mg total) by mouth at bedtime. 03/06/14   Diannia Rudereborah Ross, MD  DULoxetine HCl (CYMBALTA PO) Take 30 mg by mouth daily.     Historical Provider, MD  losartan (COZAAR) 25 MG tablet Take 25 mg by mouth daily.    Historical Provider, MD  norethindrone (MICRONOR,CAMILA,ERRIN) 0.35 MG tablet Take 1 tablet by mouth daily.    Historical Provider, MD  simvastatin (ZOCOR) 20 MG tablet Take 20 mg by mouth daily.    Historical Provider, MD  sitaGLIPtin-metformin (JANUMET) 50-1000 MG per tablet Take 1 tablet by mouth 2 (two) times daily with a meal.    Historical Provider, MD   BP 138/102 mmHg  Pulse 130  Temp(Src) 98.4 F (36.9 C) (Oral)  Resp 18  Ht 5\' 4"  (1.626 m)  Wt 189 lb (85.73 kg)  BMI 32.43 kg/m2  SpO2 96% Physical Exam  Constitutional: She is oriented to person, place, and time. She appears well-developed and well-nourished.  HENT:  Head: Normocephalic.  Eyes: EOM are normal.  Neck: Normal range of motion.  Pulmonary/Chest: Effort normal.  Abdominal: She exhibits no distension.  Musculoskeletal:  Swelling of the posterior left wrist, distally.  Full range of motion of left wrist.  Mild nonspecific  tenderness of the anatomic snuffbox on the left.  Normal left radial pulse.  Full range of motion of left elbow and left shoulder.  Neurological: She is alert and oriented to person, place, and time.  Psychiatric: She has a normal mood and affect.  Nursing note and vitals reviewed.   ED Course  Procedures (including critical care time) Labs Review Labs Reviewed - No data to display  Imaging Review Dg Wrist Complete Left  04/18/2014   CLINICAL DATA:  Status post fall on outstretched left hand 1 hour ago. Swelling at the distal left forearm. Initial encounter.  EXAM: LEFT WRIST - COMPLETE 3+ VIEW  COMPARISON:  Left forearm radiographs performed 04/07/2008  FINDINGS: There is no evidence of fracture or dislocation. The carpal rows are intact, and  demonstrate normal alignment. The joint spaces are preserved.  Diffuse dorsal soft tissue swelling is noted at the wrist.  IMPRESSION: No evidence of fracture or dislocation.   Electronically Signed   By: Roanna RaiderJeffery  Chang M.D.   On: 04/18/2014 02:21  I personally reviewed the imaging tests through PACS system I reviewed available ER/hospitalization records through the EMR    EKG Interpretation None      MDM   Final diagnoses:  Wrist pain    Mild snuffbox tenderness.  Swelling of left wrist.  X-rays without obvious fracture.  Patient be placed in a Velcro thumb spica and asked to follow-up with orthopedic surgery.  Discharge home on anti-inflammatories.    Lyanne CoKevin M Bunny Lowdermilk, MD 04/18/14 [phone]

## 2014-04-18 NOTE — ED Notes (Signed)
Pt c/o left wrist pain. Obvious swelling noted to wrist. Pt reports she tripped over something on the floor on the way back to bed and caught self with that extremity.

## 2014-04-25 ENCOUNTER — Encounter (HOSPITAL_COMMUNITY): Payer: Self-pay | Admitting: Psychology

## 2014-04-25 NOTE — Progress Notes (Signed)
PROGRESS NOTE  Patient:  Ruth [name]   DOB: 30-Oct-1975  MR Number: [id number]  [facility]: [facility] [address] [place] [address] Dept: [phone]  Start: 10 AM End: 11 AM  Provider/Observer:     Hershal CoriaJohn R Rodenbough PSYD  Chief Complaint:      Chief Complaint  Patient presents with  . Anxiety  . Panic Attack    Reason For Service:    The [facility] because of significant issues of extreme/severe anxiety and depression. The patient also reports events consistent with panic attacks that include feeling like she was going to have a heart attack and died the patient reports his major recent stressors have to do with the death of her mother 5 months ago and strained relationships with her father.  The patient reports that she is always had significant anxiety going back to elementary school. She reports a these symptoms have been debilitating. The patient reports that she has difficulty focusing and getting out of bed. The patient reports that she has trouble leaving her house and going in public. The patient reports that she has always been very shy and anxious. She reports that while she and her mother had difficulty and the patient was a teenager they became close as adults and the patient took care of her mother and she was dying from effects of diabetes. The patient's mother gangrene in one of her feet and refused to have her foot at. The patient reports she watched her mother die.  The patient describes difficult symptoms related to severe insomnia. She reports that she's had a number of attempts with psychotropic medications including Ambien and and while it helped her fall asleep she would still not stay asleep.  Psychosocial stressors that have developed recently are due to the fact that her father has started a relationship with another woman  very recently. This other woman purchased the house next door to the patient and her father. The father has been trying to get the patient and her children to develop a relationship with this woman but the patient is very upset by this.   Interventions Strategy:  Cognitive/behavioral psychotherapeutic interventions  Participation Level:   Active  Participation Quality:  Appropriate      Behavioral Observation:  Well Groomed, Alert, and Appropriate.   Current Psychosocial Factors: The patient reports that there is been significant improvement in her relationship with her father and that she has been actively working on coping strategies we have develop.  Content of Session:   Review current symptoms and worked on cognitive behavioral interventions and today was a special focus on issues related to her panic attacks and strategies to reduce the impact of these symptoms.  Current Status:   The patient reports that while she continues to have a lot of anxiety her panic attacks are less frequent and that her anxiety is improving..  Patient Progress:   The patient is still struggling with the loss of her mother and engaging in activities that likely exacerbate the symptoms.  Target Goals:   Target goal include reducing intensity, severity, duration of issues related to her panic disorder and anxiety as well as more [place]-term issues related to her depression. The patient is still experiencing a great deal of anger around issues associated with her mother passing.  Last Reviewed:   03/19/2014  Goals Addressed Today:    Goals  addressed today has to do with teaching her relaxation techniques and other issues associated with the treatment of panic disorder as well as working on issues related to her anger and guilt associated with her mother die.  Impression/Diagnosis:  the patient has a Sek history of significant anxiety and depression and recently lost her mother which exacerbated his Scialdone-standing  issue she also describes symptoms consistent with panic disorder with agoraphobia. She is essentially isolating herself in her bedroom. The patient is experiencing severe insomnia. Current stressors have to do with the fact that her father started a relationship with a woman that essentially bought a house next door to them right after her mother passed away.   Diagnosis:    Axis I: Generalized anxiety disorder  Panic disorder with agoraphobia        RODENBOUGH,JOHN R, PsyD 04/25/2014

## 2014-05-05 ENCOUNTER — Other Ambulatory Visit (HOSPITAL_COMMUNITY): Payer: Self-pay | Admitting: Psychiatry

## 2014-05-07 ENCOUNTER — Encounter (HOSPITAL_COMMUNITY): Payer: Self-pay | Admitting: Psychology

## 2014-05-07 ENCOUNTER — Ambulatory Visit (INDEPENDENT_AMBULATORY_CARE_PROVIDER_SITE_OTHER): Payer: MEDICAID | Admitting: Psychology

## 2014-05-07 DIAGNOSIS — F411 Generalized anxiety disorder: Secondary | ICD-10-CM

## 2014-05-07 DIAGNOSIS — F331 Major depressive disorder, recurrent, moderate: Secondary | ICD-10-CM

## 2014-05-07 DIAGNOSIS — F4001 Agoraphobia with panic disorder: Secondary | ICD-10-CM

## 2014-05-07 NOTE — Progress Notes (Signed)
PROGRESS NOTE  Patient:  Ruth [name]   DOB: 08/10/1975  MR Number: [id number]  Location: [facility] [address] [place] [address] Dept: [phone]  Start: 10 AM End: 11 AM  Provider/Observer:     Hershal CoriaJohn R Rodenbough PSYD  Chief Complaint:      Chief Complaint  Patient presents with  . Depression  . Anxiety  . Panic Attack    Reason For Service:    The [facility] because of significant issues of extreme/severe anxiety and depression. The patient also reports events consistent with panic attacks that include feeling like she was going to have a heart attack and died the patient reports his major recent stressors have to do with the death of her mother 5 months ago and strained relationships with her father.  The patient reports that she is always had significant anxiety going back to elementary school. She reports a these symptoms have been debilitating. The patient reports that she has difficulty focusing and getting out of bed. The patient reports that she has trouble leaving her house and going in public. The patient reports that she has always been very shy and anxious. She reports that while she and her mother had difficulty and the patient was a teenager they became close as adults and the patient took care of her mother and she was dying from effects of diabetes. The patient's mother gangrene in one of her feet and refused to have her foot at. The patient reports she watched her mother die.  The patient describes difficult symptoms related to severe insomnia. She reports that she's had a number of attempts with psychotropic medications including Ambien and and while it helped her fall asleep she would still not stay asleep.  Psychosocial stressors that have developed recently are due to the fact that her father has started a relationship  with another woman very recently. This other woman purchased the house next door to the patient and her father. The father has been trying to get the patient and her children to develop a relationship with this woman but the patient is very upset by this.   Interventions Strategy:  Cognitive/behavioral psychotherapeutic interventions  Participation Level:   Active  Participation Quality:  Appropriate      Behavioral Observation:  Well Groomed, Alert, and Appropriate.   Current Psychosocial Factors: The patient comes in today along with her daughter.  She reports  That she and her daughter both having very difficult time dealing with the loss of the patient's mother. She reports that she has been a lot of time crying over Thanksgiving and preparing  The Thanksgiving meal. The patient reports that she has been trying to work on coping skills that she has continued to do very poorly. She wanted to bring her daughter in to talk about with the daughter and she can do to help each other as well as plan for the daughter getting a counselor here.  Content of Session:   Review current symptoms and worked on cognitive behavioral interventions and today was a special focus on issues related to her panic attacks and strategies to reduce the impact of these symptoms.  Current Status:   The patient reports that she  Has been dealing with more depression but reports that she has been doing better as far as the frequency and intensity of her panic attacks. She continues  to work on Pharmacologistcoping skills around panic attack issues.  Patient Progress:   The patient is still struggling with the loss of her mother and engaging in activities that likely exacerbate the symptoms.  Target Goals:   Target goal include reducing intensity, severity, duration of issues related to her panic disorder and anxiety as well as more [place]z-term issues related to her depression. The patient is still experiencing a great deal of anger around  issues associated with her mother passing.  Last Reviewed:   05/07/2014  Goals Addressed Today:    Goals addressed today has to do with teaching her relaxation techniques and other issues associated with the treatment of panic disorder as well as working on issues related to her anger and guilt associated with her mother die.  Impression/Diagnosis:  the patient has a Huizenga history of significant anxiety and depression and recently lost her mother which exacerbated his [place]-standing issue she also describes symptoms consistent with panic disorder with agoraphobia. She is essentially isolating herself in her bedroom. The patient is experiencing severe insomnia. Current stressors have to do with the fact that her father started a relationship with a woman that essentially bought a house next door to them right after her mother passed away.   Diagnosis:    Axis I: Generalized anxiety disorder  Panic disorder with agoraphobia  Major depressive disorder, recurrent episode, moderate        RODENBOUGH,JOHN R, PsyD 05/07/2014

## 2014-05-19 ENCOUNTER — Emergency Department (HOSPITAL_COMMUNITY)
Admission: EM | Admit: 2014-05-19 | Discharge: 2014-05-19 | Disposition: A | Discharge status: Home / Self Care | Payer: Medicaid Other | Attending: Emergency Medicine | Admitting: Emergency Medicine

## 2014-05-19 ENCOUNTER — Encounter (HOSPITAL_COMMUNITY): Payer: Self-pay | Admitting: *Deleted

## 2014-05-19 DIAGNOSIS — E119 Type 2 diabetes mellitus without complications: Secondary | ICD-10-CM | POA: Diagnosis not present

## 2014-05-19 DIAGNOSIS — Z87891 Personal history of nicotine dependence: Secondary | ICD-10-CM | POA: Insufficient documentation

## 2014-05-19 DIAGNOSIS — Z791 Long term (current) use of non-steroidal anti-inflammatories (NSAID): Secondary | ICD-10-CM | POA: Diagnosis not present

## 2014-05-19 DIAGNOSIS — R1110 Vomiting, unspecified: Secondary | ICD-10-CM | POA: Diagnosis not present

## 2014-05-19 DIAGNOSIS — S3992XA Unspecified injury of lower back, initial encounter: Secondary | ICD-10-CM | POA: Insufficient documentation

## 2014-05-19 DIAGNOSIS — F419 Anxiety disorder, unspecified: Secondary | ICD-10-CM | POA: Insufficient documentation

## 2014-05-19 DIAGNOSIS — Y9289 Other specified places as the place of occurrence of the external cause: Secondary | ICD-10-CM | POA: Insufficient documentation

## 2014-05-19 DIAGNOSIS — M1990 Unspecified osteoarthritis, unspecified site: Secondary | ICD-10-CM | POA: Diagnosis not present

## 2014-05-19 DIAGNOSIS — Y9389 Activity, other specified: Secondary | ICD-10-CM | POA: Diagnosis not present

## 2014-05-19 DIAGNOSIS — Z79899 Other long term (current) drug therapy: Secondary | ICD-10-CM | POA: Diagnosis not present

## 2014-05-19 DIAGNOSIS — G8929 Other chronic pain: Secondary | ICD-10-CM | POA: Diagnosis not present

## 2014-05-19 DIAGNOSIS — F329 Major depressive disorder, single episode, unspecified: Secondary | ICD-10-CM | POA: Diagnosis not present

## 2014-05-19 DIAGNOSIS — F909 Attention-deficit hyperactivity disorder, unspecified type: Secondary | ICD-10-CM | POA: Insufficient documentation

## 2014-05-19 DIAGNOSIS — M545 Low back pain, unspecified: Secondary | ICD-10-CM

## 2014-05-19 DIAGNOSIS — Y998 Other external cause status: Secondary | ICD-10-CM | POA: Diagnosis not present

## 2014-05-19 DIAGNOSIS — X58XXXA Exposure to other specified factors, initial encounter: Secondary | ICD-10-CM | POA: Insufficient documentation

## 2014-05-19 HISTORY — DX: Other chronic pain: G89.29

## 2014-05-19 HISTORY — DX: Dorsalgia, unspecified: M54.9

## 2014-05-19 MED ORDER — ONDANSETRON HCL 8 MG PO TABS: 8.0000 mg | ORAL_TABLET | Freq: Three times a day (TID) | ORAL | Status: DC | PRN

## 2014-05-19 MED ORDER — KETOROLAC TROMETHAMINE 30 MG/ML IJ SOLN
30.0000 mg | Freq: Once | INTRAMUSCULAR | Status: AC
  Administered 2014-05-19: 30 mg via INTRAMUSCULAR
  Filled 2014-05-19: qty 1

## 2014-05-19 MED ORDER — ONDANSETRON 8 MG PO TBDP
8.0000 mg | ORAL_TABLET | Freq: Once | ORAL | Status: AC
  Administered 2014-05-19: 8 mg via ORAL
  Filled 2014-05-19: qty 1

## 2014-05-19 NOTE — Discharge Instructions (Signed)

## 2014-05-19 NOTE — ED Provider Notes (Signed)
CSN: [id number]     Arrival date & time 05/19/14  0112 History   First MD Initiated Contact with Patient 05/19/14 0127     Chief Complaint  Patient presents with  . Back Pain     Patient is a 39 y.o. female presenting with back pain. The history is provided by the patient.  Back Pain Location:  Lumbar spine Quality:  Aching Pain severity:  Moderate Onset quality:  Gradual Timing:  Constant Progression:  Worsening Chronicity:  Recurrent Context comment:  Taking down christmas tree Relieved by:  Nothing Worsened by:  Movement Associated symptoms: no bladder incontinence, no fever and no weakness   Patient reports h/o chronic back pain that she has frequently She reports that yesterday she was taking down Xmas Tree and this worsened her back pain No falls/injury No new weakness in her legs She reports pain is so severe that she vomited  She denies h/o back surgery previously Past Medical History  Diagnosis Date  . Diabetes mellitus without complication   . DJD (degenerative joint disease)   . AC (acromioclavicular) joint bone spurs   . ADHD (attention deficit hyperactivity disorder)   . Anxiety   . Depression   . Chronic back pain    Past Surgical History  Procedure Laterality Date  . Cholecystectomy    . Tubual ligation    . Tubal ligation     Family History  Problem Relation Age of Onset  . Bipolar disorder Mother   . Drug abuse Brother   . ADD / ADHD Son    History  Substance Use Topics  . Smoking status: Former Smoker -- 0.50 packs/day    Types: Cigarettes  . Smokeless tobacco: Not on file  . Alcohol Use: No   OB History    No data available     Review of Systems  Constitutional: Negative for fever.  Respiratory: Negative for cough.   Gastrointestinal: Positive for vomiting.  Genitourinary: Negative for bladder incontinence.  Musculoskeletal: Positive for back pain.  Neurological: Negative for weakness.  All other systems reviewed and are  negative.     Allergies  Review of patient's allergies indicates no known allergies.  Home Medications   Prior to Admission medications   Medication Sig Start Date End Date Taking? Authorizing Provider  amphetamine-dextroamphetamine (ADDERALL XR) 20 MG 24 hr capsule Take 1 capsule (20 mg total) by mouth daily. 04/05/14   Diannia Ruder, MD  amphetamine-dextroamphetamine (ADDERALL XR) 20 MG 24 hr capsule Take 1 capsule (20 mg total) by mouth daily. 04/05/14   Diannia Ruder, MD  diazepam (VALIUM) 10 MG tablet Take 1 tablet (10 mg total) by mouth at bedtime. 03/06/14   Diannia Ruder, MD  DULoxetine HCl (CYMBALTA PO) Take 30 mg by mouth daily.     Historical Provider, MD  losartan (COZAAR) 25 MG tablet Take 25 mg by mouth daily.    Historical Provider, MD  naproxen (NAPROSYN) 500 MG tablet Take 1 tablet (500 mg total) by mouth 2 (two) times daily. 04/18/14   Lyanne Co, MD  norethindrone (MICRONOR,CAMILA,ERRIN) 0.35 MG tablet Take 1 tablet by mouth daily.    Historical Provider, MD  ondansetron (ZOFRAN) 8 MG tablet Take 1 tablet (8 mg total) by mouth every 8 (eight) hours as needed. 05/19/14   Joya Gaskins, MD  simvastatin (ZOCOR) 20 MG tablet Take 20 mg by mouth daily.    Historical Provider, MD  sitaGLIPtin-metformin (JANUMET) 50-1000 MG per tablet Take 1 tablet by mouth  2 (two) times daily with a meal.    Historical Provider, MD   BP 130/83 mmHg  Pulse 87  Temp(Src) 98.2 F (36.8 C) (Oral)  Resp 18  Ht  (1.626 m)  Wt 189 lb (85.73 kg)  BMI 32.43 kg/m2  SpO2 97% Physical Exam CONSTITUTIONAL: Well developed/well nourished HEAD: Normocephalic/atraumatic EYES: EOMI/PERRL ENMT: Mucous membranes moist NECK: supple no meningeal signs SPINE/BACK:diffuse lumbar spinal and paraspinal tenderness.  No bruising/crepitance/stepoffs noted to spine CV: S1/S2 noted, no murmurs/rubs/gallops noted LUNGS: Lungs are clear to auscultation bilaterally, no apparent distress ABDOMEN: soft,  nontender, no rebound or guarding GU:no cva tenderness NEURO: Awake/alert, equal motor 5/5 strength noted with the following: hip flexion/knee flexion/extension, foot dorsi/plantar flexion, great toe extension intact bilaterally, no clonus bilaterally, plantar reflex appropriate (toes downgoing), no sensory deficit in any dermatome.  Equal patellar/achilles reflex noted (2+) in bilateral lower extremities.  Pt is able to ambulate unassisted. EXTREMITIES: pulses normal, full ROM SKIN: warm, color normal PSYCH: no abnormalities of mood noted, alert and oriented to situation   ED Course  Procedures     Medications  ketorolac (TORADOL) 30 MG/ML injection 30 mg (not administered)  ondansetron (ZOFRAN-ODT) disintegrating tablet 8 mg (8 mg Oral Given 05/19/14 0157)   1:59 AM Pt with h/o chronic back pain here with worsening pain after moving XMAS tree No focal neuro deficits She already has tramadol from PCP Will give toradl/zofran D/c home with zofran only and can call her PCP for further chronic pain management We discussed strict return precautions Pt in no distress and not actively vomiting when I am in room  MDM   Final diagnoses:  Acute lumbar back pain    Nursing notes including past medical history and social history reviewed and considered in documentation Previous records reviewed and considered Narcotic database reviewed and considered in decision making     Joya Gaskins, MD 05/19/14 0200

## 2014-05-19 NOTE — ED Notes (Addendum)
Pt reporting history of chronic back pain, but reports pain got worse today after taking down christmas tree. Reports that pain radiates down both legs and has been intense enough today to cause nausea.  States that her PCP has prescribed ultram for chronic pain, but "it doesn't do anything".  Pt reports she only has relief from Percocet or Vicodin.

## 2014-06-04 ENCOUNTER — Ambulatory Visit (HOSPITAL_COMMUNITY): Payer: Self-pay | Admitting: Psychiatry

## 2014-06-05 ENCOUNTER — Ambulatory Visit (INDEPENDENT_AMBULATORY_CARE_PROVIDER_SITE_OTHER): Payer: MEDICAID | Admitting: Psychiatry

## 2014-06-05 ENCOUNTER — Encounter (HOSPITAL_COMMUNITY): Payer: Self-pay | Admitting: Psychiatry

## 2014-06-05 VITALS — BP 152/111 | HR 107 | Ht 64.0 in | Wt 195.0 lb

## 2014-06-05 DIAGNOSIS — F331 Major depressive disorder, recurrent, moderate: Secondary | ICD-10-CM

## 2014-06-05 DIAGNOSIS — F411 Generalized anxiety disorder: Secondary | ICD-10-CM

## 2014-06-05 DIAGNOSIS — F900 Attention-deficit hyperactivity disorder, predominantly inattentive type: Secondary | ICD-10-CM

## 2014-06-05 MED ORDER — AMPHETAMINE-DEXTROAMPHET ER 20 MG PO CP24: 20.0000 mg | ORAL_CAPSULE | Freq: Every day | ORAL | Status: DC

## 2014-06-05 MED ORDER — DIAZEPAM 10 MG PO TABS: 10.0000 mg | ORAL_TABLET | Freq: Every day | ORAL | Status: DC

## 2014-06-05 MED ORDER — DULOXETINE HCL 30 MG PO CPEP: 30.0000 mg | ORAL_CAPSULE | Freq: Every day | ORAL | Status: DC
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
## 2014-06-11 ENCOUNTER — Ambulatory Visit (HOSPITAL_COMMUNITY): Payer: Self-pay | Admitting: Psychology

## 2014-07-06 ENCOUNTER — Ambulatory Visit (HOSPITAL_COMMUNITY): Payer: Self-pay | Admitting: Psychology

## 2014-07-30 ENCOUNTER — Encounter (HOSPITAL_COMMUNITY): Payer: Self-pay | Admitting: Psychology

## 2014-07-30 ENCOUNTER — Ambulatory Visit (INDEPENDENT_AMBULATORY_CARE_PROVIDER_SITE_OTHER): Payer: MEDICAID | Admitting: Psychology

## 2014-07-30 DIAGNOSIS — F411 Generalized anxiety disorder: Secondary | ICD-10-CM

## 2014-07-30 DIAGNOSIS — F331 Major depressive disorder, recurrent, moderate: Secondary | ICD-10-CM

## 2014-07-30 NOTE — Progress Notes (Addendum)
PROGRESS NOTE  Patient:  Ruth [name]   DOB: 1976-01-30  MR Number: [id number]  Location: [facility] [address] [place] [address] Dept: [phone]  Start: 9 AM End: 10 AM  Provider/Observer:     Hershal Coria PSYD  Chief Complaint:      Chief Complaint  Patient presents with  . Anxiety  . Depression    Reason For Service:    The patient was referred by [facility] because of significant issues of extreme/severe anxiety and depression. The patient also reports events consistent with panic attacks that include feeling like she was going to have a heart attack and died the patient reports his major recent stressors have to do with the death of her mother 5 months ago and strained relationships with her father.  The patient reports that she is always had significant anxiety going back to elementary school. She reports a these symptoms have been debilitating. The patient reports that she has difficulty focusing and getting out of bed. The patient reports that she has trouble leaving her house and going in public. The patient reports that she has always been very shy and anxious. She reports that while she and her mother had difficulty and the patient was a teenager they became close as adults and the patient took care of her mother and she was dying from effects of diabetes. The patient's mother gangrene in one of her feet and refused to have her foot at. The patient reports she watched her mother die.  The patient describes difficult symptoms related to severe insomnia. She reports that she's had a number of attempts with psychotropic medications including Ambien and and while it helped her fall asleep she would still not stay asleep.  Psychosocial stressors that have developed recently are due to the fact that her father has started a relationship with another woman  very recently. This other woman purchased the house next door to the patient and her father. The father has been trying to get the patient and her children to develop a relationship with this woman but the patient is very upset by this.   Interventions Strategy:  Cognitive/behavioral psychotherapeutic interventions  Participation Level:   Active  Participation Quality:  Appropriate      Behavioral Observation:  Well Groomed, Alert, and Appropriate.   Current Psychosocial Factors: The patient reports that she has been doing much better.  She has gotten some people out of her life that were destructive and working on fostering more healthy relationships with others.  She reports that she is much less angry on a day to day bases and has been coping with mother's death much better.  Content of Session:   Review current symptoms and worked on cognitive behavioral interventions and today was a special focus on issues related to her panic attacks and strategies to reduce the impact of these symptoms.  Current Status:   The patient reports that she has continued to improve.  She has been walking and eating her vegetables as well as working on sleep hygrine.  She reports that all of these have helped a great deal.  She reports that her doctor was amazed how much her A1C has improved.  Patient Progress:   The patient is still struggling with the loss of her mother and engaging in activities that likely exacerbate the symptoms.  Target Goals:   Target goal include  reducing intensity, severity, duration of issues related to her panic disorder and anxiety as well as more [place]-term issues related to her depression. The patient is still experiencing a great deal of anger around issues associated with her mother passing.  Last Reviewed:   07/30/2014  Goals Addressed Today:    Goals addressed today has to do with teaching her relaxation techniques and other issues associated with the treatment of panic disorder  as well as working on issues related to her anger and guilt associated with her mother die.  Impression/Diagnosis:  the patient has a Leccese history of significant anxiety and depression and recently lost her mother which exacerbated his [place]-standing issue she also describes symptoms consistent with panic disorder with agoraphobia. She is essentially isolating herself in her bedroom. The patient is experiencing severe insomnia. Current stressors have to do with the fact that her father started a relationship with a woman that essentially bought a house next door to them right after her mother passed away.   Diagnosis:    Axis I: Generalized anxiety disorder  Major depressive disorder, recurrent episode, moderate        RODENBOUGH,JOHN R, PsyD 07/30/2014

## 2014-09-03 ENCOUNTER — Ambulatory Visit (HOSPITAL_COMMUNITY): Payer: Self-pay | Admitting: Psychology

## 2014-09-04 ENCOUNTER — Ambulatory Visit (INDEPENDENT_AMBULATORY_CARE_PROVIDER_SITE_OTHER): Payer: MEDICAID | Admitting: Psychiatry

## 2014-09-04 ENCOUNTER — Encounter (HOSPITAL_COMMUNITY): Payer: Self-pay | Admitting: Psychiatry

## 2014-09-04 VITALS — BP 147/107 | HR 113 | Ht 64.0 in | Wt 198.4 lb

## 2014-09-04 DIAGNOSIS — F411 Generalized anxiety disorder: Secondary | ICD-10-CM | POA: Diagnosis not present

## 2014-09-04 DIAGNOSIS — F900 Attention-deficit hyperactivity disorder, predominantly inattentive type: Secondary | ICD-10-CM

## 2014-09-04 MED ORDER — AMPHETAMINE-DEXTROAMPHET ER 20 MG PO CP24: 20.0000 mg | ORAL_CAPSULE | Freq: Every day | ORAL | Status: DC

## 2014-09-04 MED ORDER — DIAZEPAM 10 MG PO TABS: 10.0000 mg | ORAL_TABLET | Freq: Every day | ORAL | Status: DC

## 2014-09-04 MED ORDER — DULOXETINE HCL 30 MG PO CPEP: 30.0000 mg | ORAL_CAPSULE | Freq: Every day | ORAL | Status: DC
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
## 2014-09-24 ENCOUNTER — Ambulatory Visit (HOSPITAL_COMMUNITY): Payer: Self-pay | Admitting: Psychology

## 2014-10-22 ENCOUNTER — Ambulatory Visit (INDEPENDENT_AMBULATORY_CARE_PROVIDER_SITE_OTHER): Payer: MEDICAID | Admitting: Psychology

## 2014-10-22 ENCOUNTER — Encounter (HOSPITAL_COMMUNITY): Payer: Self-pay | Admitting: Psychology

## 2014-10-22 DIAGNOSIS — F411 Generalized anxiety disorder: Secondary | ICD-10-CM
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
## 2014-11-26 ENCOUNTER — Ambulatory Visit (HOSPITAL_COMMUNITY): Payer: Self-pay | Admitting: Psychology

## 2014-12-03 ENCOUNTER — Encounter (HOSPITAL_COMMUNITY): Payer: Self-pay | Admitting: Psychiatry

## 2014-12-03 ENCOUNTER — Ambulatory Visit (INDEPENDENT_AMBULATORY_CARE_PROVIDER_SITE_OTHER): Payer: Medicaid Other | Admitting: Psychiatry

## 2014-12-03 VITALS — BP 130/98 | Ht 64.0 in | Wt 190.0 lb

## 2014-12-03 DIAGNOSIS — F411 Generalized anxiety disorder: Secondary | ICD-10-CM | POA: Diagnosis not present

## 2014-12-03 DIAGNOSIS — F900 Attention-deficit hyperactivity disorder, predominantly inattentive type: Secondary | ICD-10-CM | POA: Diagnosis not present

## 2014-12-03 MED ORDER — AMPHETAMINE-DEXTROAMPHETAMINE 20 MG PO TABS: 20.0000 mg | ORAL_TABLET | Freq: Two times a day (BID) | ORAL | Status: DC

## 2014-12-03 MED ORDER — DIAZEPAM 10 MG PO TABS: 10.0000 mg | ORAL_TABLET | Freq: Every day | ORAL | Status: DC
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
## 2014-12-31 ENCOUNTER — Ambulatory Visit (HOSPITAL_COMMUNITY): Payer: Self-pay | Admitting: Psychology

## 2015-01-14 ENCOUNTER — Ambulatory Visit (HOSPITAL_COMMUNITY): Payer: Self-pay | Admitting: Psychology

## 2015-01-31 ENCOUNTER — Telehealth (HOSPITAL_COMMUNITY): Payer: Self-pay | Admitting: *Deleted

## 2015-01-31 NOTE — Telephone Encounter (Signed)
Pt called stating that she would like for Dr. Tenny Craw to give her pharmacy permission to fill herADDERALL  this Saturday due to her medication will be out on the 18th which is Sunday but her pharmacy is closed that day. Pt number [phone]

## 2015-01-31 NOTE — Telephone Encounter (Signed)
You can try and call pharmacy but they may not agree because it is a controlled drug

## 2015-01-31 NOTE — Telephone Encounter (Signed)
Per Dr. Tenny Craw to try and call pt pharmacist to see if they would let pt get her medication early if they would let he. called Mellody Dance and informed him and he said they would as [place] as the Dr. Tenny Craw approve it. Informed Mellody Dance then they should fill medication on Saturday. Mellody Dance Agreed

## 2015-02-04 ENCOUNTER — Ambulatory Visit (HOSPITAL_COMMUNITY): Payer: Self-pay | Admitting: Psychology

## 2015-02-08 ENCOUNTER — Encounter (HOSPITAL_COMMUNITY): Payer: Self-pay | Admitting: *Deleted

## 2015-02-08 ENCOUNTER — Emergency Department (HOSPITAL_COMMUNITY)
Admission: EM | Admit: 2015-02-08 | Discharge: 2015-02-08 | Disposition: A | Discharge status: Home / Self Care | Payer: Medicaid Other | Attending: Emergency Medicine | Admitting: Emergency Medicine

## 2015-02-08 DIAGNOSIS — F419 Anxiety disorder, unspecified: Secondary | ICD-10-CM | POA: Diagnosis not present

## 2015-02-08 DIAGNOSIS — M1990 Unspecified osteoarthritis, unspecified site: Secondary | ICD-10-CM | POA: Insufficient documentation

## 2015-02-08 DIAGNOSIS — J340 Abscess, furuncle and carbuncle of nose: Secondary | ICD-10-CM | POA: Insufficient documentation

## 2015-02-08 DIAGNOSIS — Z793 Long term (current) use of hormonal contraceptives: Secondary | ICD-10-CM | POA: Diagnosis not present

## 2015-02-08 DIAGNOSIS — R112 Nausea with vomiting, unspecified: Secondary | ICD-10-CM

## 2015-02-08 DIAGNOSIS — F909 Attention-deficit hyperactivity disorder, unspecified type: Secondary | ICD-10-CM | POA: Diagnosis not present

## 2015-02-08 DIAGNOSIS — G8929 Other chronic pain: Secondary | ICD-10-CM | POA: Insufficient documentation

## 2015-02-08 DIAGNOSIS — R000 Tachycardia, unspecified: Secondary | ICD-10-CM | POA: Diagnosis not present

## 2015-02-08 DIAGNOSIS — F329 Major depressive disorder, single episode, unspecified: Secondary | ICD-10-CM | POA: Diagnosis not present

## 2015-02-08 DIAGNOSIS — Z791 Long term (current) use of non-steroidal anti-inflammatories (NSAID): Secondary | ICD-10-CM | POA: Diagnosis not present

## 2015-02-08 DIAGNOSIS — E1165 Type 2 diabetes mellitus with hyperglycemia: Secondary | ICD-10-CM | POA: Insufficient documentation

## 2015-02-08 DIAGNOSIS — Z3202 Encounter for pregnancy test, result negative: Secondary | ICD-10-CM | POA: Insufficient documentation

## 2015-02-08 DIAGNOSIS — Z87891 Personal history of nicotine dependence: Secondary | ICD-10-CM | POA: Diagnosis not present

## 2015-02-08 DIAGNOSIS — R739 Hyperglycemia, unspecified: Secondary | ICD-10-CM

## 2015-02-08 DIAGNOSIS — Z79899 Other long term (current) drug therapy: Secondary | ICD-10-CM | POA: Diagnosis not present

## 2015-02-08 LAB — CBC WITH DIFFERENTIAL/PLATELET
BASOS ABS: 0 10*3/uL (ref 0.0–0.1)
Basophils Relative: 0 %
Eosinophils Absolute: 0.1 10*3/uL (ref 0.0–0.7)
Eosinophils Relative: 1 %
HEMATOCRIT: 40.5 % (ref 36.0–46.0)
Hemoglobin: 14 g/dL (ref 12.0–15.0)
LYMPHS PCT: 15 %
Lymphs Abs: 1.6 10*3/uL (ref 0.7–4.0)
MCH: 32.4 pg (ref 26.0–34.0)
MCHC: 34.6 g/dL (ref 30.0–36.0)
MCV: 93.8 fL (ref 78.0–100.0)
MONO ABS: 0.5 10*3/uL (ref 0.1–1.0)
Monocytes Relative: 5 %
NEUTROS ABS: 8.1 10*3/uL — AB (ref 1.7–7.7)
Neutrophils Relative %: 79 %
Platelets: 180 10*3/uL (ref 150–400)
RBC: 4.32 MIL/uL (ref 3.87–5.11)
RDW: 12.6 % (ref 11.5–15.5)
WBC: 10.3 10*3/uL (ref 4.0–10.5)

## 2015-02-08 LAB — BASIC METABOLIC PANEL
ANION GAP: 16 — AB (ref 5–15)
BUN: 7 mg/dL (ref 6–20)
CHLORIDE: 102 mmol/L (ref 101–111)
CO2: 20 mmol/L — AB (ref 22–32)
Calcium: 8.8 mg/dL — ABNORMAL LOW (ref 8.9–10.3)
Creatinine, Ser: 0.53 mg/dL (ref 0.44–1.00)
GFR calc Af Amer: >60 mL/min (ref >60)
GFR calc non Af Amer: >60 mL/min (ref >60)
GLUCOSE: 227 mg/dL — AB (ref 65–99)
POTASSIUM: 4.2 mmol/L (ref 3.5–5.1)
Sodium: 138 mmol/L (ref 135–145)

## 2015-02-08 LAB — URINE MICROSCOPIC-ADD ON

## 2015-02-08 LAB — URINALYSIS, ROUTINE W REFLEX MICROSCOPIC
Bilirubin Urine: NEGATIVE
Glucose, UA: 100 mg/dL — AB
Ketones, ur: NEGATIVE mg/dL
NITRITE: NEGATIVE
Protein, ur: NEGATIVE mg/dL
SPECIFIC GRAVITY, URINE: 1.02 (ref 1.005–1.030)
UROBILINOGEN UA: 0.2 mg/dL (ref 0.0–1.0)
pH: 6 (ref 5.0–8.0)

## 2015-02-08 LAB — POC URINE PREG, ED: Preg Test, Ur: NEGATIVE

## 2015-02-08 LAB — CBG MONITORING, ED: Glucose-Capillary: 151 mg/dL — ABNORMAL HIGH (ref 65–99)

## 2015-02-08 MED ORDER — MORPHINE SULFATE (PF) 4 MG/ML IV SOLN
4.0000 mg | Freq: Once | INTRAVENOUS | Status: AC
  Administered 2015-02-08: 4 mg via INTRAVENOUS
  Filled 2015-02-08: qty 1

## 2015-02-08 MED ORDER — CLINDAMYCIN HCL 300 MG PO CAPS: 300.0000 mg | ORAL_CAPSULE | Freq: Four times a day (QID) | ORAL | Status: DC

## 2015-02-08 MED ORDER — SODIUM CHLORIDE 0.9 % IV BOLUS (SEPSIS)
1000.0000 mL | Freq: Once | INTRAVENOUS | Status: AC
  Administered 2015-02-08: 1000 mL via INTRAVENOUS

## 2015-02-08 MED ORDER — VANCOMYCIN HCL IN DEXTROSE 1-5 GM/200ML-% IV SOLN
1000.0000 mg | Freq: Once | INTRAVENOUS | Status: AC
  Administered 2015-02-08: 1000 mg via INTRAVENOUS
  Filled 2015-02-08: qty 200

## 2015-02-08 MED ORDER — ONDANSETRON HCL 8 MG PO TABS: 8.0000 mg | ORAL_TABLET | Freq: Three times a day (TID) | ORAL | Status: DC | PRN

## 2015-02-08 MED ORDER — ONDANSETRON HCL 4 MG/2ML IJ SOLN
4.0000 mg | Freq: Once | INTRAMUSCULAR | Status: AC
  Administered 2015-02-08: 4 mg via INTRAVENOUS
  Filled 2015-02-08: qty 2

## 2015-02-08 MED ORDER — KETOROLAC TROMETHAMINE 30 MG/ML IJ SOLN
30.0000 mg | Freq: Once | INTRAMUSCULAR | Status: AC
  Administered 2015-02-08: 30 mg via INTRAVENOUS
  Filled 2015-02-08: qty 1

## 2015-02-08 MED ORDER — OXYCODONE-ACETAMINOPHEN 5-325 MG PO TABS: 1.0000 | ORAL_TABLET | ORAL | Status: DC | PRN

## 2015-02-08 MED ORDER — HYDROMORPHONE HCL 1 MG/ML IJ SOLN
1.0000 mg | Freq: Once | INTRAMUSCULAR | Status: AC
  Administered 2015-02-08: 1 mg via INTRAVENOUS
  Filled 2015-02-08: qty 1

## 2015-02-08 NOTE — ED Notes (Signed)
Pt states irritation began a week ago, started out as a "bump". Nose is now red and swollen. Pt states she began vomiting last night. Unable to keep anything down.

## 2015-02-08 NOTE — ED Notes (Signed)
Ambulated pt around the nurses station; pt states she is feeling dizzy when walking.

## 2015-02-08 NOTE — ED Provider Notes (Signed)
CSN: [id number]     Arrival date & time 02/08/15  0746 History   First MD Initiated Contact with Patient 02/08/15 0802     Chief Complaint  Patient presents with  . Emesis     (Consider location/radiation/quality/duration/timing/severity/associated sxs/prior Treatment) HPI  Ruth [name] is a 39 y.o. female with DM, who presents to the Emergency Department complaining of pain, redness and swelling to the tip of her nose.  She states her symptoms began as a small "pimple" just inside her left nostril 3-4 days ago and now has became very painful and continues to increase in size.  She also reports nausea and vomiting for two days and unable to keep down any food or fluids.  She states that she hasn't been able to take her routine medications or ibuprofen because of the vomiting.  Nothing makes her symptoms better, palpation or blowing her nose makes the pain worse.  She denies fever, abdominal or chest pain, shortness of breath, diarrhea, or rash  Past Medical History  Diagnosis Date  . Diabetes mellitus without complication   . DJD (degenerative joint disease)   . AC (acromioclavicular) joint bone spurs   . ADHD (attention deficit hyperactivity disorder)   . Anxiety   . Depression   . Chronic back pain    Past Surgical History  Procedure Laterality Date  . Cholecystectomy    . Tubual ligation    . Tubal ligation     Family History  Problem Relation Age of Onset  . Bipolar disorder Mother   . Drug abuse Brother   . ADD / ADHD Son    Social History  Substance Use Topics  . Smoking status: Former Smoker -- 0.50 packs/day    Types: Cigarettes  . Smokeless tobacco: None  . Alcohol Use: No   OB History    No data available     Review of Systems  Constitutional: Negative for fever and chills.  HENT: Positive for facial swelling (swelling, redness and pain to the tip of her nose). Negative for trouble swallowing.   Gastrointestinal: Positive for nausea and vomiting.  Negative for abdominal pain, diarrhea and constipation.  Genitourinary: Negative for dysuria.  Musculoskeletal: Negative for joint swelling, arthralgias and neck pain.  Neurological: Negative for dizziness, syncope, weakness and headaches.  Hematological: Negative for adenopathy.  All other systems reviewed and are negative.     Allergies  Review of patient's allergies indicates no known allergies.  Home Medications   Prior to Admission medications   Medication Sig Start Date End Date Taking? Authorizing Mel Langan  amphetamine-dextroamphetamine (ADDERALL) 20 MG tablet Take 1 tablet (20 mg total) by mouth 2 (two) times daily. 12/03/14 12/03/15  Myrlene Broker, MD  amphetamine-dextroamphetamine (ADDERALL) 20 MG tablet Take 1 tablet (20 mg total) by mouth 2 (two) times daily. 12/03/14 12/03/15  Myrlene Broker, MD  amphetamine-dextroamphetamine (ADDERALL) 20 MG tablet Take 1 tablet (20 mg total) by mouth 2 (two) times daily. 12/03/14 12/03/15  Myrlene Broker, MD  diazepam (VALIUM) 10 MG tablet Take 1 tablet (10 mg total) by mouth at bedtime. 12/03/14   Myrlene Broker, MD  DULoxetine (CYMBALTA) 30 MG capsule Take 1 capsule (30 mg total) by mouth daily. 09/04/14   Myrlene Broker, MD  losartan (COZAAR) 25 MG tablet Take 25 mg by mouth daily.    Historical Tyon Cerasoli, MD  naproxen (NAPROSYN) 500 MG tablet Take 1 tablet (500 mg total) by mouth 2 (two) times daily. 04/18/14  Azalia Bilis, MD  norethindrone (MICRONOR,CAMILA,ERRIN) 0.35 MG tablet Take 1 tablet by mouth daily.    Historical Dicy Smigel, MD  ondansetron (ZOFRAN) 8 MG tablet Take 1 tablet (8 mg total) by mouth every 8 (eight) hours as needed. 05/19/14   Zadie Rhine, MD  simvastatin (ZOCOR) 20 MG tablet Take 20 mg by mouth daily.    Historical Mitchell Iwanicki, MD  sitaGLIPtin-metformin (JANUMET) 50-1000 MG per tablet Take 1 tablet by mouth 2 (two) times daily with a meal.    Historical Courtland Reas, MD   BP 155/111 mmHg  Pulse 131  Temp(Src) 98.7 F (37.1  C) (Oral)  Resp 16  Ht  (1.626 m)  Wt 183 lb (83.008 kg)  BMI 31.40 kg/m2  SpO2 100% Physical Exam  Constitutional: She is oriented to person, place, and time. She appears well-developed and well-nourished. No distress.  HENT:  Head: Normocephalic and atraumatic.  Nose: Mucosal edema and sinus tenderness present.  Mouth/Throat: Uvula is midline and oropharynx is clear and moist. Mucous membranes are dry. No trismus in the jaw. No uvula swelling.  Small open lesion just inside the left upper nostril.  No drainage or crusting.  Remaining tip of the nose and anterior septum are edematous and erythematous.    Eyes: EOM are normal. Pupils are equal, round, and reactive to light.  Neck: Normal range of motion. Neck supple.  Cardiovascular: Normal heart sounds.  Tachycardia present.   No murmur heard. Pulmonary/Chest: Effort normal and breath sounds normal. No respiratory distress.  Abdominal: Soft. She exhibits no distension and no mass. There is no tenderness. There is no rebound and no guarding.  Musculoskeletal: Normal range of motion.  Lymphadenopathy:    She has no cervical adenopathy.  Neurological: She is alert and oriented to person, place, and time. She exhibits normal muscle tone. Coordination normal.  Skin: Skin is warm and dry.  Psychiatric: She has a normal mood and affect.  Nursing note and vitals reviewed.   ED Course  Procedures (including critical care time) Labs Review Labs Reviewed  CBC WITH DIFFERENTIAL/PLATELET - Abnormal; Notable for the following:    Neutro Abs 8.1 (*)    All other components within normal limits  BASIC METABOLIC PANEL - Abnormal; Notable for the following:    CO2 20 (*)    Glucose, Bld 227 (*)    Calcium 8.8 (*)    Anion gap 16 (*)    All other components within normal limits  URINALYSIS, ROUTINE W REFLEX MICROSCOPIC (NOT AT [facility]) - Abnormal; Notable for the following:    Glucose, UA 100 (*)    Hgb urine dipstick LARGE (*)     Leukocytes, UA MODERATE (*)    All other components within normal limits  URINE MICROSCOPIC-ADD ON - Abnormal; Notable for the following:    Squamous Epithelial / LPF MANY (*)    Bacteria, UA MANY (*)    All other components within normal limits  CBG MONITORING, ED - Abnormal; Notable for the following:    Glucose-Capillary 151 (*)    All other components within normal limits  POC URINE PREG, ED    Imaging Review No results found. I have personally reviewed and evaluated these images and lab results as part of my medical decision-making.   EKG Interpretation None      MDM   Final diagnoses:  Cellulitis of nose  Hyperglycemia  Non-intractable vomiting with nausea, vomiting of unspecified type   Pt is feeling better after 3L IVF's.  Vitals  improved.  She is non-toxic appearing.  Labs were reviewed and discussed with patient and with Dr. Estell Harpin.  Pt also seen by Dr. Estell Harpin.   I have offered her admission given that she was vomiting and her anion gap was slightly elevated at 16.  She states that she is feeling better and prefers to go home and return here tomorrow morning for recheck.  She appears stable for d/c and I will rx clindamycin, percocet and zofran.        Pauline Aus, PA-C 02/08/15 2130  Bethann Berkshire, MD 02/11/15 [id number]

## 2015-02-08 NOTE — Discharge Instructions (Signed)
Cellulitis Cellulitis is an infection of the skin and the tissue under the skin. The infected area is usually red and tender. This happens most often in the arms and lower legs. HOME CARE   Take your antibiotic medicine as told. Finish the medicine even if you start to feel better.  Keep the infected arm or leg raised (elevated).  Put a warm cloth on the area up to 4 times per day.  Only take medicines as told by your doctor.  Keep all doctor visits as told. GET HELP IF:  You see red streaks on the skin coming from the infected area.  Your red area gets bigger or turns a dark color.  Your bone or joint under the infected area is painful after the skin heals.  Your infection comes back in the same area or different area.  You have a puffy (swollen) bump in the infected area.  You have new symptoms.  You have a fever. GET HELP RIGHT AWAY IF:   You feel very sleepy.  You throw up (vomit) or have watery poop (diarrhea).  You feel sick and have muscle aches and pains. MAKE SURE YOU:   Understand these instructions.  Will watch your condition.  Will get help right away if you are not doing well or get worse. Document Released: 10/21/2007 Document Revised: 09/18/2013 Document Reviewed: 07/20/2011 [facility] Patient Information 2015 [place], [place]. This information is not intended to replace advice given to you by your health care provider. Make sure you discuss any questions you have with your health care provider.  High Blood Sugar High blood sugar (hyperglycemia) means that the level of sugar in your blood is higher than it should be. Signs of high blood sugar include:  Feeling thirsty.  Frequent peeing (urinating).  Feeling tired or sleepy.  Dry mouth.  Vision changes.  Feeling weak.  Feeling hungry but losing weight.  Numbness and tingling in your hands or feet.  Headache. When you ignore these signs, your blood sugar may keep going up. These problems may  get worse, and other problems may begin. HOME CARE  Check your blood sugars as told by your doctor. Write down the numbers with the date and time.  Take the right amount of insulin or diabetes pills at the right time. Write down the dose with date and time.  Refill your insulin or diabetes pills before running out.  Watch what you eat. Follow your meal plan.  Drink liquids without sugar, such as water. Check with your doctor if you have kidney or heart disease.  Follow your doctor's orders for exercise. Exercise at the same time of day.  Keep your doctor's appointments. GET HELP RIGHT AWAY IF:   You have trouble thinking or are confused.  You have fast breathing with fruity smelling breath.  You pass out (faint).  You have 2 to 3 days of high blood sugars and you do not know why.  You have chest pain.  You are feeling sick to your stomach (nauseous) or throwing up (vomiting).  You have sudden vision changes. MAKE SURE YOU:   Understand these instructions.  Will watch your condition.  Will get help right away if you are not doing well or get worse. Document Released: 03/01/2009 Document Revised: 07/27/2011 Document Reviewed: 03/01/2009 [facility] Patient Information 2015 [place], [place]. This information is not intended to replace advice given to you by your health care provider. Make sure you discuss any questions you have with your health care provider.  Nausea and Vomiting Nausea means you feel sick to your stomach. Throwing up (vomiting) is a reflex where stomach contents come out of your mouth. HOME CARE   Take medicine as told by your doctor.  Do not force yourself to eat. However, you do need to drink fluids.  If you feel like eating, eat a normal diet as told by your doctor.  Eat rice, wheat, potatoes, bread, lean meats, yogurt, fruits, and vegetables.  Avoid high-fat foods.  Drink enough fluids to keep your pee (urine) clear or pale yellow.  Ask your  doctor how to replace body fluid losses (rehydrate). Signs of body fluid loss (dehydration) include:  Feeling very thirsty.  Dry lips and mouth.  Feeling dizzy.  Dark pee.  Peeing less than normal.  Feeling confused.  Fast breathing or heart rate. GET HELP RIGHT AWAY IF:   You have blood in your throw up.  You have black or bloody poop (stool).  You have a bad headache or stiff neck.  You feel confused.  You have bad belly (abdominal) pain.  You have chest pain or trouble breathing.  You do not pee at least once every 8 hours.  You have cold, clammy skin.  You keep throwing up after 24 to 48 hours.  You have a fever. MAKE SURE YOU:   Understand these instructions.  Will watch your condition.  Will get help right away if you are not doing well or get worse. Document Released: 10/21/2007 Document Revised: 07/27/2011 Document Reviewed: 10/03/2010 [facility] Patient Information 2015 [place], [place]. This information is not intended to replace advice given to you by your health care provider. Make sure you discuss any questions you have with your health care provider.

## 2015-02-09 ENCOUNTER — Emergency Department (HOSPITAL_COMMUNITY)
Admission: EM | Admit: 2015-02-09 | Discharge: 2015-02-09 | Disposition: A | Discharge status: Home / Self Care | Payer: Medicaid Other | Attending: Emergency Medicine | Admitting: Emergency Medicine

## 2015-02-09 ENCOUNTER — Encounter (HOSPITAL_COMMUNITY): Payer: Self-pay | Admitting: *Deleted

## 2015-02-09 DIAGNOSIS — J340 Abscess, furuncle and carbuncle of nose: Secondary | ICD-10-CM

## 2015-02-09 DIAGNOSIS — F329 Major depressive disorder, single episode, unspecified: Secondary | ICD-10-CM | POA: Diagnosis not present

## 2015-02-09 DIAGNOSIS — Z794 Long term (current) use of insulin: Secondary | ICD-10-CM | POA: Insufficient documentation

## 2015-02-09 DIAGNOSIS — E1165 Type 2 diabetes mellitus with hyperglycemia: Secondary | ICD-10-CM | POA: Diagnosis not present

## 2015-02-09 DIAGNOSIS — R509 Fever, unspecified: Secondary | ICD-10-CM | POA: Diagnosis present

## 2015-02-09 DIAGNOSIS — Z79899 Other long term (current) drug therapy: Secondary | ICD-10-CM | POA: Diagnosis not present

## 2015-02-09 DIAGNOSIS — Z87891 Personal history of nicotine dependence: Secondary | ICD-10-CM | POA: Insufficient documentation

## 2015-02-09 DIAGNOSIS — E860 Dehydration: Secondary | ICD-10-CM

## 2015-02-09 DIAGNOSIS — Z8739 Personal history of other diseases of the musculoskeletal system and connective tissue: Secondary | ICD-10-CM | POA: Diagnosis not present

## 2015-02-09 DIAGNOSIS — G8929 Other chronic pain: Secondary | ICD-10-CM | POA: Insufficient documentation

## 2015-02-09 DIAGNOSIS — F909 Attention-deficit hyperactivity disorder, unspecified type: Secondary | ICD-10-CM | POA: Insufficient documentation

## 2015-02-09 DIAGNOSIS — R739 Hyperglycemia, unspecified: Secondary | ICD-10-CM

## 2015-02-09 LAB — CBC WITH DIFFERENTIAL/PLATELET
BASOS ABS: 0 10*3/uL (ref 0.0–0.1)
BASOS PCT: 0 %
Eosinophils Absolute: 0.1 10*3/uL (ref 0.0–0.7)
Eosinophils Relative: 1 %
HEMATOCRIT: 39.7 % (ref 36.0–46.0)
HEMOGLOBIN: 13.4 g/dL (ref 12.0–15.0)
LYMPHS PCT: 22 %
Lymphs Abs: 1.8 10*3/uL (ref 0.7–4.0)
MCH: 32.1 pg (ref 26.0–34.0)
MCHC: 33.8 g/dL (ref 30.0–36.0)
MCV: 95 fL (ref 78.0–100.0)
Monocytes Absolute: 0.5 10*3/uL (ref 0.1–1.0)
Monocytes Relative: 7 %
NEUTROS ABS: 5.6 10*3/uL (ref 1.7–7.7)
NEUTROS PCT: 70 %
Platelets: 167 10*3/uL (ref 150–400)
RBC: 4.18 MIL/uL (ref 3.87–5.11)
RDW: 13 % (ref 11.5–15.5)
WBC: 8.1 10*3/uL (ref 4.0–10.5)

## 2015-02-09 LAB — BASIC METABOLIC PANEL
ANION GAP: 8 (ref 5–15)
BUN: 7 mg/dL (ref 6–20)
CALCIUM: 8.4 mg/dL — AB (ref 8.9–10.3)
CO2: 20 mmol/L — ABNORMAL LOW (ref 22–32)
Chloride: 106 mmol/L (ref 101–111)
Creatinine, Ser: 0.66 mg/dL (ref 0.44–1.00)
Glucose, Bld: 266 mg/dL — ABNORMAL HIGH (ref 65–99)
POTASSIUM: 4.4 mmol/L (ref 3.5–5.1)
Sodium: 134 mmol/L — ABNORMAL LOW (ref 135–145)

## 2015-02-09 LAB — CBG MONITORING, ED: Glucose-Capillary: 199 mg/dL — ABNORMAL HIGH (ref 65–99)

## 2015-02-09 MED ORDER — HYDROCODONE-ACETAMINOPHEN 5-325 MG PO TABS: 1.0000 | ORAL_TABLET | Freq: Four times a day (QID) | ORAL | Status: DC | PRN

## 2015-02-09 MED ORDER — MORPHINE SULFATE (PF) 4 MG/ML IV SOLN
4.0000 mg | Freq: Once | INTRAVENOUS | Status: AC
  Administered 2015-02-09: 4 mg via INTRAVENOUS
  Filled 2015-02-09: qty 1

## 2015-02-09 MED ORDER — ONDANSETRON HCL 4 MG/2ML IJ SOLN
4.0000 mg | Freq: Once | INTRAMUSCULAR | Status: AC
  Administered 2015-02-09: 4 mg via INTRAVENOUS
  Filled 2015-02-09: qty 2

## 2015-02-09 MED ORDER — SODIUM CHLORIDE 0.9 % IV BOLUS (SEPSIS)
1000.0000 mL | Freq: Once | INTRAVENOUS | Status: AC
  Administered 2015-02-09: 1000 mL via INTRAVENOUS

## 2015-02-09 MED ORDER — LIDOCAINE HCL (PF) 1 % IJ SOLN
INTRAMUSCULAR | Status: AC
  Administered 2015-02-09: 5 mL
  Filled 2015-02-09: qty 5

## 2015-02-09 MED ORDER — LIDOCAINE HCL (PF) 1 % IJ SOLN
5.0000 mL | Freq: Once | INTRAMUSCULAR | Status: AC
  Administered 2015-02-09: 10:00:00

## 2015-02-09 MED ORDER — POVIDONE-IODINE 10 % EX SOLN
CUTANEOUS | Status: AC
  Filled 2015-02-09: qty 118

## 2015-02-09 MED ORDER — HYDROCODONE-ACETAMINOPHEN 5-325 MG PO TABS
1.0000 | ORAL_TABLET | Freq: Once | ORAL | Status: AC
  Administered 2015-02-09: 1 via ORAL
  Filled 2015-02-09: qty 1

## 2015-02-09 MED ORDER — LIDOCAINE HCL (PF) 1 % IJ SOLN
INTRAMUSCULAR | Status: AC
  Filled 2015-02-09: qty 5

## 2015-02-09 MED ORDER — LIDOCAINE HCL (PF) 1 % IJ SOLN
5.0000 mL | Freq: Once | INTRAMUSCULAR | Status: AC
  Administered 2015-02-09: 5 mL

## 2015-02-09 MED ORDER — CLINDAMYCIN PHOSPHATE 600 MG/50ML IV SOLN
600.0000 mg | Freq: Once | INTRAVENOUS | Status: AC
  Administered 2015-02-09: 600 mg via INTRAVENOUS
  Filled 2015-02-09: qty 50

## 2015-02-09 NOTE — Discharge Instructions (Signed)
Abscess °An abscess (boil or furuncle) is an infected area on or under the skin. This area is filled with yellowish-white fluid (pus) and other material (debris). °HOME CARE  °· Only take medicines as told by your doctor. °· If you were given antibiotic medicine, take it as directed. Finish the medicine even if you start to feel better. °· If gauze is used, follow your doctor's directions for changing the gauze. °· To avoid spreading the infection: °¨ Keep your abscess covered with a bandage. °¨ Wash your hands well. °¨ Do not share personal care items, towels, or whirlpools with others. °¨ Avoid skin contact with others. °· Keep your skin and clothes clean around the abscess. °· Keep all doctor visits as told. °GET HELP RIGHT AWAY IF:  °· You have more pain, puffiness (swelling), or redness in the wound site. °· You have more fluid or blood coming from the wound site. °· You have muscle aches, chills, or you feel sick. °· You have a fever. °MAKE SURE YOU:  °· Understand these instructions. °· Will watch your condition. °· Will get help right away if you are not doing well or get worse. °Document Released: 10/21/2007 Document Revised: 11/03/2011 Document Reviewed: 07/17/2011 °[employer]® Patient Information ©2015 [employer]. This information is not intended to replace advice given to you by your health care provider. Make sure you discuss any questions you have with your health care provider. ° °

## 2015-02-09 NOTE — ED Provider Notes (Signed)
CSN: [id number]     Arrival date & time 02/09/15  0745 History   First MD Initiated Contact with Patient 02/09/15 [id number]     Chief Complaint  Patient presents with  . Emesis  Patient gave verbal permission to utilize photo for medical documentation only The image was not stored on any personal device   Patient is a 39 y.o. female presenting with vomiting. The history is provided by the patient.  Emesis Severity:  Moderate Timing:  Intermittent Progression:  Worsening Chronicity:  New Relieved by:  Nothing Worsened by:  Liquids Associated symptoms: chills and fever   Associated symptoms: no abdominal pain   Risk factors: diabetes   Pt reports recent diagnosis of nasal cellulitis, seen in ED yesterday, went home with antibiotics but is unable to keep any fluids down and unable to tolerate her medications.   She reports the pain/swelling in her nose are worsening She reports fever/chills She also reports dental pain  No cp/abd pain or other complaints  Past Medical History  Diagnosis Date  . Diabetes mellitus without complication   . DJD (degenerative joint disease)   . AC (acromioclavicular) joint bone spurs   . ADHD (attention deficit hyperactivity disorder)   . Anxiety   . Depression   . Chronic back pain    Past Surgical History  Procedure Laterality Date  . Cholecystectomy    . Tubual ligation    . Tubal ligation     Family History  Problem Relation Age of Onset  . Bipolar disorder Mother   . Drug abuse Brother   . ADD / ADHD Son    Social History  Substance Use Topics  . Smoking status: Former Smoker -- 0.50 packs/day    Types: Cigarettes  . Smokeless tobacco: None  . Alcohol Use: No   OB History    No data available     Review of Systems  Constitutional: Positive for chills.  Cardiovascular: Negative for chest pain.  Gastrointestinal: Positive for vomiting. Negative for abdominal pain.  Skin: Positive for wound.  All other systems reviewed and are  negative.     Allergies  Review of patient's allergies indicates no known allergies.  Home Medications   Prior to Admission medications   Medication Sig Start Date End Date Taking? Authorizing Provider  amphetamine-dextroamphetamine (ADDERALL) 20 MG tablet Take 1 tablet (20 mg total) by mouth 2 (two) times daily. 12/03/14 12/03/15  Myrlene Broker, MD  clindamycin (CLEOCIN) 300 MG capsule Take 1 capsule (300 mg total) by mouth 4 (four) times daily. For 7 days 02/08/15   Tammy Triplett, PA-C  Insulin Glargine (LANTUS SOLOSTAR) 100 UNIT/ML Solostar Pen Inject 16 Units into the skin at bedtime.    Historical Provider, MD  losartan (COZAAR) 25 MG tablet Take 25 mg by mouth daily.    Historical Provider, MD  nortriptyline (PAMELOR) 25 MG capsule Take 25 mg by mouth at bedtime.    Historical Provider, MD  ondansetron (ZOFRAN) 8 MG tablet Take 1 tablet (8 mg total) by mouth every 8 (eight) hours as needed for nausea or vomiting. 02/08/15   Tammy Triplett, PA-C  oxyCODONE-acetaminophen (PERCOCET/ROXICET) 5-325 MG per tablet Take 1 tablet by mouth every 4 (four) hours as needed. 02/08/15   Tammy Triplett, PA-C  simvastatin (ZOCOR) 20 MG tablet Take 20 mg by mouth daily.    Historical Provider, MD  sitaGLIPtin-metformin (JANUMET) 50-1000 MG per tablet Take 1 tablet by mouth 2 (two) times daily with a meal.  Historical Provider, MD   BP 126/86 mmHg  Pulse 107  Temp(Src) 97.7 F (36.5 C) (Oral)  Resp 16  Ht  (1.626 m)  Wt 183 lb (83.008 kg)  BMI 31.40 kg/m2  SpO2 99% Physical Exam CONSTITUTIONAL: Well developed/well nourished HEAD: Normocephalic/atraumatic EYES: EOMI/PERRL ENMT: Mucous membranes moist, erythematous/edematous nose. Her nose is tender to palpation.  No drainage.  Bilateral septum are erythematous/edematous.  Tenderness to palpation of maxillary teeth but no gingival abscess is noted.  No trismus is noted NECK: supple no meningeal signs CV: S1/S2 noted, no murmurs/rubs/gallops  noted LUNGS: Lungs are clear to auscultation bilaterally, no apparent distress ABDOMEN: soft, nontender NEURO: Pt is awake/alert/appropriate, moves all extremitiesx4.  No facial droop.   EXTREMITIES: pulses normal/equal, full ROM SKIN: warm, color normal PSYCH: flat affect      ED Course  NERVE BLOCK Date/Time: 02/09/2015 9:44 AM Performed by: Zadie Rhine Authorized by: Zadie Rhine Patient identity confirmed: verbally with patient Preparation: Patient was prepped and draped in the usual sterile fashion. Patient position: sitting Location technique: anatomical landmarks Local anesthetic: lidocaine 1% without epinephrine Anesthetic total: 3 ml Outcome: pain improved Patient tolerance: Patient tolerated the procedure well with no immediate complications Comments: Bilateral infraorbital nerve block for facial/nasal pain No complications Pt tolerated well     Needle DRAINAGE Performed by: Joya Gaskins Consent: Verbal consent obtained. Risks and benefits: risks, benefits and alternatives were discussed Type: abscess  Body area: nsoe  Anesthesia: local infiltration/nerve block Complexity: complex Drainage: purulent  Drainage amount: moderate  Patient tolerance: Patient tolerated the procedure well with no immediate complications.    Labs Review Labs Reviewed  BASIC METABOLIC PANEL - Abnormal; Notable for the following:    Sodium 134 (*)    CO2 20 (*)    Glucose, Bld 266 (*)    Calcium 8.4 (*)    All other components within normal limits  CBG MONITORING, ED - Abnormal; Notable for the following:    Glucose-Capillary 199 (*)    All other components within normal limits  CBC WITH DIFFERENTIAL/PLATELET   I have personally reviewed and evaluated these lab results as part of my medical decision-making.  9:45 AM i was able to extract pus from both sides of nose.   Due to location, I deffered incision and drainage Pt improved Will try PO challenge  and reassess No anion gap to suggest DKA 10:47 AM Pt improved  She is taking PO fluids She is not toxic No signs of DKA She is not vomiting She requests vicodin instead of percocet due to nausea from percocet Advised recheck in 48 hours We discussed return precautions which would indicate earlier visit    Medications  povidone-iodine (BETADINE) 10 % external solution (not administered)  clindamycin (CLEOCIN) IVPB 600 mg (0 mg Intravenous Stopped 02/09/15 0945)  ondansetron (ZOFRAN) injection 4 mg (4 mg Intravenous Given 02/09/15 0846)  morphine 4 MG/ML injection 4 mg (4 mg Intravenous Given 02/09/15 0846)  sodium chloride 0.9 % bolus 1,000 mL (0 mLs Intravenous Stopped 02/09/15 0945)  morphine 4 MG/ML injection 4 mg (4 mg Intravenous Given 02/09/15 0903)  lidocaine (PF) (XYLOCAINE) 1 % injection 5 mL (5 mLs Other Given by Other 02/09/15 0915)  sodium chloride 0.9 % bolus 1,000 mL (1,000 mLs Intravenous New Bag/Given 02/09/15 1013)  lidocaine (PF) (XYLOCAINE) 1 % injection 5 mL ( Other Given by Other 02/09/15 0945)  HYDROcodone-acetaminophen (NORCO/VICODIN) 5-325 MG per tablet 1 tablet (1 tablet Oral Given 02/09/15 1014)  MDM   Final diagnoses:  Hyperglycemia  Dehydration  Cellulitis of nasal tip  Abscess of nose    Nursing notes including past medical history and social history reviewed and considered in documentation Labs/vital reviewed myself and considered during evaluation Previous records reviewed and considered     Zadie Rhine, MD 02/09/15 1049

## 2015-02-09 NOTE — ED Notes (Signed)
Pt was seen here yesterday and dx with cellulitis of the nose, hyperglycemia and n/v.Pt states she continues to vomit and unable to keep anything down.

## 2015-03-05 ENCOUNTER — Encounter (HOSPITAL_COMMUNITY): Payer: Self-pay | Admitting: Psychiatry

## 2015-03-05 ENCOUNTER — Ambulatory Visit (INDEPENDENT_AMBULATORY_CARE_PROVIDER_SITE_OTHER): Payer: Medicaid Other | Admitting: Psychiatry

## 2015-03-05 VITALS — BP 137/111 | HR 120 | Ht 64.0 in | Wt 189.2 lb

## 2015-03-05 DIAGNOSIS — F900 Attention-deficit hyperactivity disorder, predominantly inattentive type: Secondary | ICD-10-CM

## 2015-03-05 DIAGNOSIS — F411 Generalized anxiety disorder: Secondary | ICD-10-CM

## 2015-03-05 MED ORDER — DIAZEPAM 10 MG PO TABS: 10.0000 mg | ORAL_TABLET | Freq: Two times a day (BID) | ORAL | Status: DC

## 2015-03-05 MED ORDER — LISDEXAMFETAMINE DIMESYLATE 40 MG PO CAPS: 40.0000 mg | ORAL_CAPSULE | ORAL | Status: DC
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
## 2015-03-18 ENCOUNTER — Ambulatory Visit (INDEPENDENT_AMBULATORY_CARE_PROVIDER_SITE_OTHER): Payer: Medicaid Other | Admitting: Psychology

## 2015-03-18 DIAGNOSIS — F4001 Agoraphobia with panic disorder: Secondary | ICD-10-CM

## 2015-03-18 DIAGNOSIS — F411 Generalized anxiety disorder: Secondary | ICD-10-CM

## 2015-03-18 DIAGNOSIS — F331 Major depressive disorder, recurrent, moderate: Secondary | ICD-10-CM

## 2015-04-01 ENCOUNTER — Encounter (HOSPITAL_COMMUNITY): Payer: Self-pay | Admitting: Psychiatry

## 2015-04-01 ENCOUNTER — Ambulatory Visit (INDEPENDENT_AMBULATORY_CARE_PROVIDER_SITE_OTHER): Payer: Medicaid Other | Admitting: Psychiatry

## 2015-04-01 VITALS — BP 141/111 | HR 127 | Ht 64.0 in | Wt 195.6 lb

## 2015-04-01 DIAGNOSIS — F331 Major depressive disorder, recurrent, moderate: Secondary | ICD-10-CM

## 2015-04-01 DIAGNOSIS — F411 Generalized anxiety disorder: Secondary | ICD-10-CM | POA: Diagnosis not present

## 2015-04-01 DIAGNOSIS — F900 Attention-deficit hyperactivity disorder, predominantly inattentive type: Secondary | ICD-10-CM

## 2015-04-01 MED ORDER — LISDEXAMFETAMINE DIMESYLATE 50 MG PO CAPS: 50.0000 mg | ORAL_CAPSULE | Freq: Every day | ORAL | Status: DC

## 2015-04-01 MED ORDER — DIAZEPAM 10 MG PO TABS: 10.0000 mg | ORAL_TABLET | Freq: Two times a day (BID) | ORAL | Status: DC
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
## 2015-04-06 ENCOUNTER — Emergency Department (HOSPITAL_COMMUNITY)
Admission: EM | Admit: 2015-04-06 | Discharge: 2015-04-06 | Disposition: A | Discharge status: Home / Self Care | Payer: Medicaid Other | Attending: Emergency Medicine | Admitting: Emergency Medicine

## 2015-04-06 ENCOUNTER — Encounter (HOSPITAL_COMMUNITY): Payer: Self-pay | Admitting: Emergency Medicine

## 2015-04-06 ENCOUNTER — Emergency Department (HOSPITAL_COMMUNITY): Payer: Medicaid Other

## 2015-04-06 DIAGNOSIS — F329 Major depressive disorder, single episode, unspecified: Secondary | ICD-10-CM | POA: Diagnosis not present

## 2015-04-06 DIAGNOSIS — Z79899 Other long term (current) drug therapy: Secondary | ICD-10-CM | POA: Insufficient documentation

## 2015-04-06 DIAGNOSIS — Z3202 Encounter for pregnancy test, result negative: Secondary | ICD-10-CM | POA: Insufficient documentation

## 2015-04-06 DIAGNOSIS — Z87891 Personal history of nicotine dependence: Secondary | ICD-10-CM | POA: Insufficient documentation

## 2015-04-06 DIAGNOSIS — F909 Attention-deficit hyperactivity disorder, unspecified type: Secondary | ICD-10-CM | POA: Insufficient documentation

## 2015-04-06 DIAGNOSIS — S8991XA Unspecified injury of right lower leg, initial encounter: Secondary | ICD-10-CM | POA: Diagnosis present

## 2015-04-06 DIAGNOSIS — W1781XA Fall down embankment (hill), initial encounter: Secondary | ICD-10-CM | POA: Insufficient documentation

## 2015-04-06 DIAGNOSIS — E119 Type 2 diabetes mellitus without complications: Secondary | ICD-10-CM | POA: Diagnosis not present

## 2015-04-06 DIAGNOSIS — Y9302 Activity, running: Secondary | ICD-10-CM | POA: Diagnosis not present

## 2015-04-06 DIAGNOSIS — Y92410 Unspecified street and highway as the place of occurrence of the external cause: Secondary | ICD-10-CM | POA: Insufficient documentation

## 2015-04-06 DIAGNOSIS — G8929 Other chronic pain: Secondary | ICD-10-CM | POA: Diagnosis not present

## 2015-04-06 DIAGNOSIS — M1990 Unspecified osteoarthritis, unspecified site: Secondary | ICD-10-CM | POA: Diagnosis not present

## 2015-04-06 DIAGNOSIS — S8391XA Sprain of unspecified site of right knee, initial encounter: Secondary | ICD-10-CM | POA: Insufficient documentation

## 2015-04-06 DIAGNOSIS — Z794 Long term (current) use of insulin: Secondary | ICD-10-CM | POA: Insufficient documentation

## 2015-04-06 DIAGNOSIS — Y998 Other external cause status: Secondary | ICD-10-CM | POA: Insufficient documentation

## 2015-04-06 DIAGNOSIS — F419 Anxiety disorder, unspecified: Secondary | ICD-10-CM | POA: Diagnosis not present

## 2015-04-06 LAB — POC URINE PREG, ED: Preg Test, Ur: NEGATIVE

## 2015-04-06 MED ORDER — IBUPROFEN 800 MG PO TABS
800.0000 mg | ORAL_TABLET | Freq: Once | ORAL | Status: AC
  Administered 2015-04-06: 800 mg via ORAL
  Filled 2015-04-06: qty 1

## 2015-04-06 MED ORDER — ACETAMINOPHEN-CODEINE #3 300-30 MG PO TABS
2.0000 | ORAL_TABLET | Freq: Once | ORAL | Status: AC
  Administered 2015-04-06: 2 via ORAL
  Filled 2015-04-06: qty 2

## 2015-04-06 MED ORDER — ACETAMINOPHEN-CODEINE #3 300-30 MG PO TABS: 1.0000 | ORAL_TABLET | Freq: Four times a day (QID) | ORAL | Status: DC | PRN

## 2015-04-06 MED ORDER — PROMETHAZINE HCL 12.5 MG PO TABS
12.5000 mg | ORAL_TABLET | Freq: Once | ORAL | Status: AC
  Administered 2015-04-06: 12.5 mg via ORAL
  Filled 2015-04-06: qty 1

## 2015-04-06 NOTE — ED Notes (Signed)
Pt states motrin does not control her pain, pt requesting stronger pain medication. PA made aware.

## 2015-04-06 NOTE — Discharge Instructions (Signed)
Your x-ray is negative. There no neurovascular deficits appreciated on your examination at this time. Please apply ice and elevate your knee is much as possible. Please see Dr. Ophelia CharterYates for orthopedic evaluation of your pain. Please see Dr. Noralyn Pickarroll for assistance with your pain management. Knee Sprain A knee sprain is a tear in the strong bands of tissue that connect the bones (ligaments) of your knee. HOME CARE  Raise (elevate) your injured knee to lessen puffiness (swelling).  To ease pain and puffiness, put ice on the injured area.  Put ice in a plastic bag.  Place a towel between your skin and the bag.  Leave the ice on for 20 minutes, 2-3 times a day.  Only take medicine as told by your doctor.  Do not leave your knee unprotected until pain and stiffness go away (usually 4-6 weeks).  If you have a cast or splint, do not get it wet. If your doctor told you to not take it off, cover it with a plastic bag when you shower or bathe. Do not swim.  Your doctor may have you do exercises to prevent or limit permanent weakness and stiffness. GET HELP RIGHT AWAY IF:   Your cast or splint becomes damaged.  Your pain gets worse.  You have a lot of pain, puffiness, or numbness below the cast or splint. MAKE SURE YOU:   Understand these instructions.  Will watch your condition.  Will get help right away if you are not doing well or get worse.   This information is not intended to replace advice given to you by your health care provider. Make sure you discuss any questions you have with your health care provider.   Document Released: 04/22/2009 Document Revised: 05/09/2013 Document Reviewed: 01/10/2013 Elsevier Interactive Patient Education [employer]2016 Elsevier Inc.
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
## 2015-04-08 ENCOUNTER — Encounter (HOSPITAL_COMMUNITY): Payer: Self-pay | Admitting: Psychology
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
## 2015-04-22 ENCOUNTER — Encounter (HOSPITAL_COMMUNITY): Payer: Self-pay | Admitting: Psychology

## 2015-04-22 ENCOUNTER — Ambulatory Visit (INDEPENDENT_AMBULATORY_CARE_PROVIDER_SITE_OTHER): Payer: Medicaid Other | Admitting: Psychology

## 2015-04-22 ENCOUNTER — Telehealth (HOSPITAL_COMMUNITY): Payer: Self-pay | Admitting: *Deleted

## 2015-04-22 DIAGNOSIS — F4001 Agoraphobia with panic disorder: Secondary | ICD-10-CM | POA: Diagnosis not present

## 2015-04-22 DIAGNOSIS — F411 Generalized anxiety disorder: Secondary | ICD-10-CM | POA: Diagnosis not present

## 2015-04-22 DIAGNOSIS — F331 Major depressive disorder, recurrent, moderate: Secondary | ICD-10-CM | POA: Diagnosis not present

## 2015-04-22 NOTE — Progress Notes (Signed)
PROGRESS NOTE  Patient:  Ruth [name]   DOB: 04/15/1976  MR Number: [id number]  [facility]: [facility] [address] [place] [id number] Dept: [phone]  Start: 9 AM End: 10 AM  Provider/Observer:     Hershal CoriaJohn R Trenika Hudson PSYD  Chief Complaint:      Chief Complaint  Patient presents with  . Depression  . Anxiety  . Agitation  . Stress  . Trauma    Reason For Service:    The patient was referred by [facility] because of significant issues of extreme/severe anxiety and depression. The patient also reports events consistent with panic attacks that include feeling like she was going to have a heart attack and died the patient reports her major recent stressors have to do with the death of her mother 5 months ago and strained relationships with her father.  The patient reports that she is always had significant anxiety going back to elementary school. She reports a these symptoms have been debilitating. The patient reports that she has difficulty focusing and getting out of bed. The patient reports that she has trouble leaving her house and going in public. The patient reports that she has always been very shy and anxious. She reports that while she and her mother had difficulty and the patient was a teenager they became close as adults and the patient took care of her mother and she was dying from effects of diabetes. The patient's mother developed gangrene in one of her feet and refused to have her foot at. The patient reports she watched her mother die.  The patient describes difficult symptoms related to severe insomnia. She reports that she's had a number of attempts with psychotropic medications including Ambien and and while it helped her fall asleep she would still not stay asleep.  Psychosocial stressors that have developed recently are due to the fact that her father  has started a relationship with another woman very recently. This other woman purchased the house next door to the patient and her father. The father has been trying to get the patient and her children to develop a relationship with this woman but the patient is very upset by this.   Interventions Strategy:  Cognitive/behavioral psychotherapeutic interventions  Participation Level:   Active  Participation Quality:  Appropriate      Behavioral Observation:  Well Groomed, Alert, and Appropriate.   Current Psychosocial Factors: The patient reports that the psychosocial stressors that she has been dealing with related to her father and his girlfriend have continued to be quite stable and has not been causing much stress for her lately. The patient reports that overall her depression anxiety have been somewhat better. She does report though that she has been having some flashbacks and nightmares around events that occurred when her mother was dying. One of them is a time where her mother coughed up blood all over her and the patient continues to have flashbacks and nightmares around these events.  Content of Session:   Review current symptoms and worked on cognitive behavioral interventions and today was a special focus on issues related to her panic attacks and strategies to reduce the impact of these symptoms.  Current Status:   The patient reports that while she has continued to improve, she is having more nightmares and flashbacks. This is resulting in her avoiding sleep for fear of having more of these nightmares. We  worked on this issue as well as working on some of the other sleep hygiene issues and issues related to her insulin-dependent type 2 diabetes.  Patient Progress:   The patient is doing better with regard to adjusting to the mother. However, she is still having nightmares and flashbacks resulting in her avoiding sleep. I will talk with Dr. Tenny Craw about this issue as well..  Target  Goals:   Target goal include reducing intensity, severity, duration of issues related to her panic disorder and anxiety as well as more [place]-term issues related to her depression. The patient is still experiencing a great deal of anger around issues associated with her mother passing.  Last Reviewed:   04/22/2015  Goals Addressed Today:    Goals addressed today has to do with teaching her relaxation techniques and other issues associated with the treatment of panic disorder as well as working on issues related to her anger and guilt associated with her mother die.  Impression/Diagnosis:  the patient has a [place] history of significant anxiety and depression and recently lost her mother which exacerbated his Y[place]asz-standing issue she also describes symptoms consistent with panic disorder with agoraphobia. She is essentially isolating herself in her bedroom. The patient is experiencing severe insomnia. Current stressors have to do with the fact that her father started a relationship with a woman that essentially bought a house next door to them right after her mother passed away.   Diagnosis:    Axis I: Generalized anxiety disorder  Major depressive disorder, recurrent episode, moderate (HCC)  Panic disorder with agoraphobia        Karson Reede R, PsyD 04/22/2015

## 2015-04-22 NOTE — Telephone Encounter (Signed)
Patient said to remind you to check to see if she can take Amitriptyline.

## 2015-04-23 ENCOUNTER — Other Ambulatory Visit (HOSPITAL_COMMUNITY): Payer: Self-pay | Admitting: Psychiatry

## 2015-04-23 MED ORDER — PRAZOSIN HCL 5 MG PO CAPS: 5.0000 mg | ORAL_CAPSULE | Freq: Every day | ORAL | Status: DC

## 2015-04-23 NOTE — Telephone Encounter (Signed)
Per pt call, she spoke with Dr. Kieth Brightlyodenbough and he suggested that Dr. Tenny Crawoss could put her on Amitriptyline due to pt having some really bad nightmares. Per pt, she was told to ask Dr. Tenny Crawoss to see if it would interact with other medications as well. Pt number is [phone]

## 2015-04-23 NOTE — Telephone Encounter (Signed)
Prazosin works better for nightmares- It is called in

## 2015-04-25 NOTE — Telephone Encounter (Signed)
Called pt and she stated that the medication is working really well

## 2015-04-29 ENCOUNTER — Telehealth (HOSPITAL_COMMUNITY): Payer: Self-pay | Admitting: *Deleted

## 2015-04-29 NOTE — Telephone Encounter (Signed)
phone call from patient, she is going out of town.  wants to know if she can get Vyvanse on 04/30/15.

## 2015-04-30 ENCOUNTER — Telehealth (HOSPITAL_COMMUNITY): Payer: Self-pay | Admitting: *Deleted

## 2015-04-30 NOTE — Telephone Encounter (Signed)
Called pt pharmacy and spoke with Mellody DanceKeith and informed him that Dr. Tenny Crawoss give permission to have to pick up medication a day early. Called pt to inform her and she showed understanding

## 2015-04-30 NOTE — Telephone Encounter (Signed)
Opened in Error.

## 2015-04-30 NOTE — Telephone Encounter (Signed)
You can call the pharmacy and ask

## 2015-04-30 NOTE — Telephone Encounter (Signed)
Pt called stating that she have to sit with her Aunt that's having Chemo today in [place] and will be with her until the weekend. Per pt, she was wondering if Dr.Ross office could approve the pharmacy to fill her medication 1 day early because she will not be in town to get her refills tomorrow. Pt number is [phone].

## 2015-04-30 NOTE — Telephone Encounter (Signed)
Message sent to provider 

## 2015-05-22 ENCOUNTER — Encounter (HOSPITAL_COMMUNITY): Payer: Self-pay | Admitting: *Deleted

## 2015-05-22 ENCOUNTER — Emergency Department (HOSPITAL_COMMUNITY): Payer: Medicaid Other

## 2015-05-22 ENCOUNTER — Emergency Department (HOSPITAL_COMMUNITY)
Admission: EM | Admit: 2015-05-22 | Discharge: 2015-05-22 | Disposition: A | Discharge status: Home / Self Care | Payer: Medicaid Other | Attending: Emergency Medicine | Admitting: Emergency Medicine

## 2015-05-22 DIAGNOSIS — S82832A Other fracture of upper and lower end of left fibula, initial encounter for closed fracture: Secondary | ICD-10-CM | POA: Insufficient documentation

## 2015-05-22 DIAGNOSIS — W1843XA Slipping, tripping and stumbling without falling due to stepping from one level to another, initial encounter: Secondary | ICD-10-CM | POA: Insufficient documentation

## 2015-05-22 DIAGNOSIS — Z7984 Long term (current) use of oral hypoglycemic drugs: Secondary | ICD-10-CM | POA: Insufficient documentation

## 2015-05-22 DIAGNOSIS — G8929 Other chronic pain: Secondary | ICD-10-CM | POA: Diagnosis not present

## 2015-05-22 DIAGNOSIS — Y9289 Other specified places as the place of occurrence of the external cause: Secondary | ICD-10-CM | POA: Diagnosis not present

## 2015-05-22 DIAGNOSIS — E119 Type 2 diabetes mellitus without complications: Secondary | ICD-10-CM | POA: Insufficient documentation

## 2015-05-22 DIAGNOSIS — F419 Anxiety disorder, unspecified: Secondary | ICD-10-CM | POA: Insufficient documentation

## 2015-05-22 DIAGNOSIS — S82402A Unspecified fracture of shaft of left fibula, initial encounter for closed fracture: Secondary | ICD-10-CM

## 2015-05-22 DIAGNOSIS — M1990 Unspecified osteoarthritis, unspecified site: Secondary | ICD-10-CM | POA: Diagnosis not present

## 2015-05-22 DIAGNOSIS — Z794 Long term (current) use of insulin: Secondary | ICD-10-CM | POA: Diagnosis not present

## 2015-05-22 DIAGNOSIS — Z79899 Other long term (current) drug therapy: Secondary | ICD-10-CM | POA: Diagnosis not present

## 2015-05-22 DIAGNOSIS — Y998 Other external cause status: Secondary | ICD-10-CM | POA: Diagnosis not present

## 2015-05-22 DIAGNOSIS — F909 Attention-deficit hyperactivity disorder, unspecified type: Secondary | ICD-10-CM | POA: Diagnosis not present

## 2015-05-22 DIAGNOSIS — S99912A Unspecified injury of left ankle, initial encounter: Secondary | ICD-10-CM | POA: Diagnosis present

## 2015-05-22 DIAGNOSIS — F329 Major depressive disorder, single episode, unspecified: Secondary | ICD-10-CM | POA: Diagnosis not present

## 2015-05-22 DIAGNOSIS — Z87891 Personal history of nicotine dependence: Secondary | ICD-10-CM | POA: Insufficient documentation

## 2015-05-22 DIAGNOSIS — Y9389 Activity, other specified: Secondary | ICD-10-CM | POA: Insufficient documentation

## 2015-05-22 MED ORDER — ONDANSETRON 8 MG PO TBDP
ORAL_TABLET | ORAL | Status: AC
  Filled 2015-05-22: qty 1

## 2015-05-22 MED ORDER — OXYCODONE-ACETAMINOPHEN 5-325 MG PO TABS: 1.0000 | ORAL_TABLET | ORAL | Status: DC | PRN

## 2015-05-22 MED ORDER — HYDROCODONE-ACETAMINOPHEN 5-325 MG PO TABS
ORAL_TABLET | ORAL | Status: AC
  Filled 2015-05-22: qty 2

## 2015-05-22 MED ORDER — HYDROCODONE-ACETAMINOPHEN 5-325 MG PO TABS
2.0000 | ORAL_TABLET | Freq: Once | ORAL | Status: AC
  Administered 2015-05-22: 2 via ORAL

## 2015-05-22 MED ORDER — ONDANSETRON HCL 4 MG PO TABS
8.0000 mg | ORAL_TABLET | Freq: Once | ORAL | Status: AC
  Administered 2015-05-22: 8 mg via ORAL

## 2015-05-22 NOTE — ED Notes (Signed)
Pt comes in via EMS for rolling out to the bed this morning. Pt expresses that she injured her left ankle.

## 2015-05-22 NOTE — Discharge Instructions (Signed)
Fibular Ankle Fracture Treated With or Without Immobilization, Adult A fibular fracture at your ankle is a break (fracture) bone in the smallest of the two bones in your lower leg, located on the outside of your leg (fibula) close to the area at your ankle joint. CAUSES  Rolling your ankle.  Twisting your ankle.  Extreme flexing or extending of your foot.  Severe force on your ankle as when falling from a distance. RISK FACTORS  Jumping activities.  Participation in sports.  Osteoporosis.  Advanced age.  Previous ankle injuries. SIGNS AND SYMPTOMS  Pain.  Swelling.  Inability to put weight on injured ankle.  Bruising.  Bone deformities at site of injury. DIAGNOSIS  This fracture is diagnosed with the help of an X-ray exam. TREATMENT  If the fractured bone did not move out of place it usually will heal without problems and does casting or splinting. If immobilization is needed for comfort or the fractured bone moved out of place and will not heal properly with immobilization, a cast or splint will be used. HOME CARE INSTRUCTIONS   Apply ice to the area of injury:  Put ice in a plastic bag.  Place a towel between your skin and the bag.  Leave the ice on for 20 minutes, 2-3 times a day.  Use crutches as directed. Resume walking without crutches as directed by your health care provider.  Only take over-the-counter or prescription medicines for pain, discomfort, or fever as directed by your health care provider.  If you have a removable splint or boot, do not remove the boot unless directed by your health care provider. SEEK MEDICAL CARE IF:   You have continued pain or more swelling  The medications do not control the pain. SEEK IMMEDIATE MEDICAL CARE IF:  You develop severe pain in the leg or foot.  Your skin or nails below the injury turn blue or grey or feel cold or numb. MAKE SURE YOU:   Understand these instructions.  Will watch your  condition.  Will get help right away if you are not doing well or get worse.   This information is not intended to replace advice given to you by your health care provider. Make sure you discuss any questions you have with your health care provider.   Document Released: 05/04/2005 Document Revised: 05/25/2014 Document Reviewed: 12/14/2012 Elsevier Interactive Patient Education 2016 [employer]   Wear the splint and use crutches to avoid weight bearing.  Use ice and elevation as much as possible for the next several days to help reduce the swelling.  Take the medications prescribed.  You may take the oxycodone prescribed for pain relief.  This will make you drowsy - do not drive within 4 hours of taking this medication.

## 2015-05-24 ENCOUNTER — Ambulatory Visit (HOSPITAL_COMMUNITY): Payer: Self-pay | Admitting: Psychology

## 2015-05-24 NOTE — ED Provider Notes (Signed)
CSN: [id number]     Arrival date & time 05/22/15  1159 History   First MD Initiated Contact with Patient 05/22/15 1259     Chief Complaint  Patient presents with  . Ankle Pain     (Consider location/radiation/quality/duration/timing/severity/associated sxs/prior Treatment) The history is provided by the patient.   Ruth [name] is a 40 y.o. female presenting with left ankle pain which occurred suddenly when she stumbled getting out of bed this am causing an inversion injury to her left ankle.  Pain is aching, constant and worse with palpation, movement and weight bearing.  She has been able to weight bear initially but pain has worsened in the past few hours.    There is no radiation of pain and the patient denies numbness distal to the injury site.  The patients treatment prior to arrival included ibuprofen, ice and rest.      Past Medical History  Diagnosis Date  . Diabetes mellitus without complication (HCC)   . DJD (degenerative joint disease)   . AC (acromioclavicular) joint bone spurs   . ADHD (attention deficit hyperactivity disorder)   . Anxiety   . Depression   . Chronic back pain    Past Surgical History  Procedure Laterality Date  . Cholecystectomy    . Tubual ligation    . Tubal ligation     Family History  Problem Relation Age of Onset  . Bipolar disorder Mother   . Drug abuse Brother   . ADD / ADHD Son    Social History  Substance Use Topics  . Smoking status: Former Smoker -- 0.50 packs/day    Types: Cigarettes  . Smokeless tobacco: None  . Alcohol Use: No   OB History    No data available     Review of Systems  Constitutional: Negative for fever.  Musculoskeletal: Positive for joint swelling and arthralgias. Negative for myalgias.  Neurological: Negative for weakness and numbness.      Allergies  Review of patient's allergies indicates no known allergies.  Home Medications   Prior to Admission medications   Medication Sig Start Date  End Date Taking? Authorizing Provider  acetaminophen-codeine (TYLENOL #3) 300-30 MG tablet Take 1-2 tablets by mouth every 6 (six) hours as needed for moderate pain. 04/06/15   Ivery Quale, PA-C  diazepam (VALIUM) 10 MG tablet Take 1 tablet (10 mg total) by mouth 2 (two) times daily. 04/01/15   Myrlene Broker, MD  ibuprofen (ADVIL,MOTRIN) 200 MG tablet Take 600 mg by mouth every 6 (six) hours as needed.    Historical Provider, MD  Insulin Glargine (LANTUS SOLOSTAR) 100 UNIT/ML Solostar Pen Inject 20 Units into the skin at bedtime.     Historical Provider, MD  lisdexamfetamine (VYVANSE) 50 MG capsule Take 1 capsule (50 mg total) by mouth daily. 04/01/15   Myrlene Broker, MD  lisdexamfetamine (VYVANSE) 50 MG capsule Take 1 capsule (50 mg total) by mouth daily. 04/01/15   Myrlene Broker, MD  lisdexamfetamine (VYVANSE) 50 MG capsule Take 1 capsule (50 mg total) by mouth daily. 04/01/15   Myrlene Broker, MD  losartan (COZAAR) 25 MG tablet Take 25 mg by mouth daily.    Historical Provider, MD  ondansetron (ZOFRAN) 8 MG tablet Take 1 tablet (8 mg total) by mouth every 8 (eight) hours as needed for nausea or vomiting. 02/08/15   Tammy Triplett, PA-C  oxyCODONE-acetaminophen (PERCOCET/ROXICET) 5-325 MG tablet Take 1 tablet by mouth every 4 (four) hours as needed. 05/22/15  Burgess AmorJulie Marlyne Totaro, PA-C  prazosin (MINIPRESS) 5 MG capsule Take 1 capsule (5 mg total) by mouth at bedtime. 04/23/15   Myrlene Brokereborah R Ross, MD  simvastatin (ZOCOR) 20 MG tablet Take 20 mg by mouth daily.    Historical Provider, MD  sitaGLIPtin-metformin (JANUMET) 50-1000 MG per tablet Take 1 tablet by mouth 2 (two) times daily with a meal.    Historical Provider, MD   BP 108/74 mmHg  Pulse 118  Temp(Src) 98 F (36.7 C) (Oral)  Resp 18  Ht 5\' 4"  (1.626 m)  Wt 87.544 kg  BMI 33.11 kg/m2  SpO2 97% Physical Exam  Constitutional: She appears well-developed and well-nourished.  HENT:  Head: Normocephalic.  Cardiovascular: Normal rate and intact  distal pulses.  Exam reveals no decreased pulses.   Pulses:      Dorsalis pedis pulses are 2+ on the right side, and 2+ on the left side.       Posterior tibial pulses are 2+ on the right side, and 2+ on the left side.  Musculoskeletal: She exhibits edema and tenderness.       Left ankle: She exhibits swelling and ecchymosis. She exhibits no deformity and normal pulse. Tenderness. Lateral malleolus tenderness found. No head of 5th metatarsal and no proximal fibula tenderness found. Achilles tendon normal.  Neurological: She is alert. No sensory deficit.  Skin: Skin is warm, dry and intact.  Nursing note and vitals reviewed.   ED Course  Procedures (including critical care time) Labs Review   Imaging Review EXAM: LEFT ANKLE COMPLETE - 3+ VIEW  COMPARISON: None.  FINDINGS: Marked soft tissue swelling overlies a coronally oblique fracture through the distal fibula, with lower margin at the level of the ankle joint. The fracture is displaced laterally and posteriorly by 2 mm. No medial fracture or ankle mortise widening.  IMPRESSION: Mildly displaced distal fibula fracture.   Electronically Signed By: Marnee SpringJonathon Watts M.D. On: 05/22/2015 12:50    Radiological studies were viewed, interpreted and considered during the medical decision making and disposition process. I agree with radiologists reading.  Results were also discussed with patient.     MDM   Final diagnoses:  Fibula fracture, left, closed, initial encounter    Pt placed in posterior splint, crutches provided.  RICE, hydrocodone. Is currently a pt of Dr. Case with appt scheduled in 5 days. Advised f/u for recheck then.  In the interim, returning for any worsened sx.     Burgess AmorJulie Berneda Piccininni, PA-C 05/24/15 [id number]  Geoffery Lyonsouglas Delo, MD 05/25/15 [phone]

## 2015-06-02 ENCOUNTER — Inpatient Hospital Stay (HOSPITAL_COMMUNITY)
Admission: EM | Admit: 2015-06-02 | Discharge: 2015-06-03 | DRG: 316 | Disposition: A | Discharge status: Home / Self Care | Payer: Medicaid Other | Attending: Internal Medicine | Admitting: Internal Medicine

## 2015-06-02 ENCOUNTER — Encounter (HOSPITAL_COMMUNITY): Payer: Self-pay | Admitting: *Deleted

## 2015-06-02 ENCOUNTER — Emergency Department (HOSPITAL_COMMUNITY): Payer: Medicaid Other

## 2015-06-02 DIAGNOSIS — Z794 Long term (current) use of insulin: Secondary | ICD-10-CM | POA: Diagnosis not present

## 2015-06-02 DIAGNOSIS — Z818 Family history of other mental and behavioral disorders: Secondary | ICD-10-CM

## 2015-06-02 DIAGNOSIS — T50905S Adverse effect of unspecified drugs, medicaments and biological substances, sequela: Secondary | ICD-10-CM | POA: Diagnosis not present

## 2015-06-02 DIAGNOSIS — Z87891 Personal history of nicotine dependence: Secondary | ICD-10-CM | POA: Diagnosis not present

## 2015-06-02 DIAGNOSIS — R579 Shock, unspecified: Secondary | ICD-10-CM

## 2015-06-02 DIAGNOSIS — T446X5A Adverse effect of alpha-adrenoreceptor antagonists, initial encounter: Secondary | ICD-10-CM | POA: Diagnosis present

## 2015-06-02 DIAGNOSIS — F4310 Post-traumatic stress disorder, unspecified: Secondary | ICD-10-CM | POA: Diagnosis present

## 2015-06-02 DIAGNOSIS — T68XXXD Hypothermia, subsequent encounter: Secondary | ICD-10-CM | POA: Diagnosis not present

## 2015-06-02 DIAGNOSIS — T450X5A Adverse effect of antiallergic and antiemetic drugs, initial encounter: Secondary | ICD-10-CM | POA: Diagnosis present

## 2015-06-02 DIAGNOSIS — R680 Hypothermia, not associated with low environmental temperature: Secondary | ICD-10-CM | POA: Diagnosis present

## 2015-06-02 DIAGNOSIS — I959 Hypotension, unspecified: Secondary | ICD-10-CM | POA: Diagnosis present

## 2015-06-02 DIAGNOSIS — E860 Dehydration: Secondary | ICD-10-CM | POA: Diagnosis present

## 2015-06-02 DIAGNOSIS — T68XXXA Hypothermia, initial encounter: Secondary | ICD-10-CM

## 2015-06-02 DIAGNOSIS — M1990 Unspecified osteoarthritis, unspecified site: Secondary | ICD-10-CM | POA: Diagnosis present

## 2015-06-02 DIAGNOSIS — Z23 Encounter for immunization: Secondary | ICD-10-CM | POA: Diagnosis not present

## 2015-06-02 DIAGNOSIS — Z791 Long term (current) use of non-steroidal anti-inflammatories (NSAID): Secondary | ICD-10-CM

## 2015-06-02 DIAGNOSIS — T428X5A Adverse effect of antiparkinsonism drugs and other central muscle-tone depressants, initial encounter: Secondary | ICD-10-CM | POA: Diagnosis present

## 2015-06-02 DIAGNOSIS — F900 Attention-deficit hyperactivity disorder, predominantly inattentive type: Secondary | ICD-10-CM | POA: Diagnosis not present

## 2015-06-02 DIAGNOSIS — T50904A Poisoning by unspecified drugs, medicaments and biological substances, undetermined, initial encounter: Secondary | ICD-10-CM

## 2015-06-02 DIAGNOSIS — F909 Attention-deficit hyperactivity disorder, unspecified type: Secondary | ICD-10-CM | POA: Diagnosis present

## 2015-06-02 DIAGNOSIS — R34 Anuria and oliguria: Secondary | ICD-10-CM

## 2015-06-02 DIAGNOSIS — E119 Type 2 diabetes mellitus without complications: Secondary | ICD-10-CM

## 2015-06-02 DIAGNOSIS — I952 Hypotension due to drugs: Secondary | ICD-10-CM | POA: Diagnosis not present

## 2015-06-02 LAB — CBC WITH DIFFERENTIAL/PLATELET
BASOS ABS: 0 10*3/uL (ref 0.0–0.1)
BASOS PCT: 0 %
EOS ABS: 0.1 10*3/uL (ref 0.0–0.7)
EOS PCT: 1 %
HCT: 43.2 % (ref 36.0–46.0)
Hemoglobin: 14.5 g/dL (ref 12.0–15.0)
LYMPHS ABS: 2.3 10*3/uL (ref 0.7–4.0)
Lymphocytes Relative: 23 %
MCH: 31.7 pg (ref 26.0–34.0)
MCHC: 33.6 g/dL (ref 30.0–36.0)
MCV: 94.3 fL (ref 78.0–100.0)
Monocytes Absolute: 0.6 10*3/uL (ref 0.1–1.0)
Monocytes Relative: 6 %
NEUTROS PCT: 70 %
Neutro Abs: 6.8 10*3/uL (ref 1.7–7.7)
PLATELETS: 199 10*3/uL (ref 150–400)
RBC: 4.58 MIL/uL (ref 3.87–5.11)
RDW: 13.2 % (ref 11.5–15.5)
WBC: 9.8 10*3/uL (ref 4.0–10.5)

## 2015-06-02 LAB — COMPREHENSIVE METABOLIC PANEL WITH GFR
ALT: 22 U/L (ref 14–54)
AST: 29 U/L (ref 15–41)
Albumin: 5 g/dL (ref 3.5–5.0)
Alkaline Phosphatase: 45 U/L (ref 38–126)
Anion gap: 16 — ABNORMAL HIGH (ref 5–15)
BUN: 19 mg/dL (ref 6–20)
CO2: 16 mmol/L — ABNORMAL LOW (ref 22–32)
Calcium: 9.8 mg/dL (ref 8.9–10.3)
Chloride: 101 mmol/L (ref 101–111)
Creatinine, Ser: 1.25 mg/dL — ABNORMAL HIGH (ref 0.44–1.00)
GFR calc Af Amer: >60 mL/min
GFR calc non Af Amer: 54 mL/min — ABNORMAL LOW
Glucose, Bld: 245 mg/dL — ABNORMAL HIGH (ref 65–99)
Potassium: 3.8 mmol/L (ref 3.5–5.1)
Sodium: 133 mmol/L — ABNORMAL LOW (ref 135–145)
Total Bilirubin: 1 mg/dL (ref 0.3–1.2)
Total Protein: 8.6 g/dL — ABNORMAL HIGH (ref 6.5–8.1)

## 2015-06-02 LAB — URINE MICROSCOPIC-ADD ON

## 2015-06-02 LAB — RAPID URINE DRUG SCREEN, HOSP PERFORMED
AMPHETAMINES: POSITIVE — AB
BARBITURATES: NOT DETECTED
Benzodiazepines: POSITIVE — AB
Cocaine: NOT DETECTED
Opiates: NOT DETECTED
TETRAHYDROCANNABINOL: NOT DETECTED

## 2015-06-02 LAB — URINALYSIS, ROUTINE W REFLEX MICROSCOPIC
BILIRUBIN URINE: NEGATIVE
Glucose, UA: NEGATIVE mg/dL
KETONES UR: 15 mg/dL — AB
LEUKOCYTES UA: NEGATIVE
NITRITE: NEGATIVE
PROTEIN: 30 mg/dL — AB
Specific Gravity, Urine: 1.01 (ref 1.005–1.030)
pH: 7.5 (ref 5.0–8.0)

## 2015-06-02 LAB — I-STAT CG4 LACTIC ACID, ED: Lactic Acid, Venous: 1.72 mmol/L (ref 0.5–2.0)

## 2015-06-02 LAB — ETHANOL: Alcohol, Ethyl (B): <5 mg/dL

## 2015-06-02 MED ORDER — SODIUM CHLORIDE 0.9 % IV BOLUS (SEPSIS)
1000.0000 mL | Freq: Once | INTRAVENOUS | Status: AC
  Administered 2015-06-02: 1000 mL via INTRAVENOUS

## 2015-06-02 MED ORDER — SODIUM CHLORIDE 0.9 % IJ SOLN
3.0000 mL | Freq: Two times a day (BID) | INTRAMUSCULAR | Status: DC
  Administered 2015-06-02 – 2015-06-03 (×2): 3 mL via INTRAVENOUS

## 2015-06-02 MED ORDER — INSULIN ASPART 100 UNIT/ML ~~LOC~~ SOLN: 0.0000 [IU] | Freq: Three times a day (TID) | SUBCUTANEOUS | Status: DC

## 2015-06-02 MED ORDER — SODIUM CHLORIDE 0.9 % IV SOLN
INTRAVENOUS | Status: DC
  Administered 2015-06-02: 23:00:00 via INTRAVENOUS

## 2015-06-02 MED ORDER — SODIUM CHLORIDE 0.9 % IV SOLN
INTRAVENOUS | Status: DC
Start: 2015-06-02 — End: 2015-06-02

## 2015-06-02 MED ORDER — ONDANSETRON HCL 4 MG PO TABS: 4.0000 mg | ORAL_TABLET | Freq: Four times a day (QID) | ORAL | Status: DC | PRN

## 2015-06-02 MED ORDER — ONDANSETRON HCL 4 MG/2ML IJ SOLN: 4.0000 mg | Freq: Three times a day (TID) | INTRAMUSCULAR | Status: DC | PRN

## 2015-06-02 MED ORDER — CLOBETASOL PROPIONATE 0.05 % EX CREA
1.0000 "application " | TOPICAL_CREAM | Freq: Two times a day (BID) | CUTANEOUS | Status: DC | PRN
  Filled 2015-06-02: qty 15

## 2015-06-02 MED ORDER — SITAGLIPTIN PHOS-METFORMIN HCL 50-1000 MG PO TABS: 1.0000 | ORAL_TABLET | Freq: Two times a day (BID) | ORAL | Status: DC

## 2015-06-02 MED ORDER — DIAZEPAM 5 MG PO TABS
10.0000 mg | ORAL_TABLET | Freq: Two times a day (BID) | ORAL | Status: DC | PRN
  Administered 2015-06-03: 10 mg via ORAL
  Filled 2015-06-02: qty 2

## 2015-06-02 MED ORDER — SIMVASTATIN 20 MG PO TABS
20.0000 mg | ORAL_TABLET | Freq: Every day | ORAL | Status: DC
  Administered 2015-06-03: 20 mg via ORAL
  Filled 2015-06-02: qty 1

## 2015-06-02 MED ORDER — LINAGLIPTIN 5 MG PO TABS
5.0000 mg | ORAL_TABLET | Freq: Two times a day (BID) | ORAL | Status: DC
  Administered 2015-06-03: 5 mg via ORAL
  Filled 2015-06-02: qty 1

## 2015-06-02 MED ORDER — ONDANSETRON HCL 4 MG/2ML IJ SOLN: 4.0000 mg | Freq: Four times a day (QID) | INTRAMUSCULAR | Status: DC | PRN

## 2015-06-02 MED ORDER — INSULIN GLARGINE 100 UNIT/ML ~~LOC~~ SOLN
20.0000 [IU] | Freq: Every day | SUBCUTANEOUS | Status: DC
  Administered 2015-06-03: 20 [IU] via SUBCUTANEOUS
  Filled 2015-06-02 (×2): qty 0.2

## 2015-06-02 MED ORDER — OXYCODONE HCL 5 MG PO TABS
5.0000 mg | ORAL_TABLET | Freq: Four times a day (QID) | ORAL | Status: DC | PRN
  Administered 2015-06-02 – 2015-06-03 (×2): 5 mg via ORAL
  Filled 2015-06-02 (×3): qty 1

## 2015-06-02 MED ORDER — CLOBETASOL PROPIONATE 0.05 % EX FOAM: 1.0000 "application " | Freq: Every day | CUTANEOUS | Status: DC

## 2015-06-02 MED ORDER — METFORMIN HCL 500 MG PO TABS
1000.0000 mg | ORAL_TABLET | Freq: Two times a day (BID) | ORAL | Status: DC
  Administered 2015-06-03: 1000 mg via ORAL
  Filled 2015-06-02: qty 2

## 2015-06-02 MED ORDER — IOHEXOL 350 MG/ML SOLN
100.0000 mL | Freq: Once | INTRAVENOUS | Status: AC | PRN
  Administered 2015-06-02: 100 mL via INTRAVENOUS

## 2015-06-02 MED ORDER — METAXALONE 800 MG PO TABS
800.0000 mg | ORAL_TABLET | Freq: Three times a day (TID) | ORAL | Status: DC | PRN
  Filled 2015-06-02: qty 1

## 2015-06-02 MED ORDER — DULOXETINE HCL 30 MG PO CPEP
30.0000 mg | ORAL_CAPSULE | Freq: Every day | ORAL | Status: DC
  Administered 2015-06-03: 30 mg via ORAL
  Filled 2015-06-02: qty 1

## 2015-06-02 MED ORDER — CEFTRIAXONE SODIUM 1 G IJ SOLR
INTRAMUSCULAR | Status: AC
  Filled 2015-06-02: qty 10

## 2015-06-02 MED ORDER — INSULIN ASPART 100 UNIT/ML ~~LOC~~ SOLN: 0.0000 [IU] | Freq: Every day | SUBCUTANEOUS | Status: DC

## 2015-06-02 MED ORDER — ENOXAPARIN SODIUM 40 MG/0.4ML ~~LOC~~ SOLN
40.0000 mg | SUBCUTANEOUS | Status: DC
  Administered 2015-06-02: 40 mg via SUBCUTANEOUS
  Filled 2015-06-02: qty 0.4

## 2015-06-02 MED ORDER — IBUPROFEN 400 MG PO TABS: 400.0000 mg | ORAL_TABLET | Freq: Four times a day (QID) | ORAL | Status: DC | PRN

## 2015-06-02 MED ORDER — DEXTROSE 5 % IV SOLN
1.0000 g | INTRAVENOUS | Status: DC
  Administered 2015-06-02: 1 g via INTRAVENOUS
  Filled 2015-06-02: qty 10

## 2015-06-02 MED ORDER — SALINE SPRAY 0.65 % NA SOLN
1.0000 | NASAL | Status: DC | PRN
Start: 2015-06-02 — End: 2015-06-03
  Administered 2015-06-03: 1 via NASAL

## 2015-06-02 NOTE — ED Notes (Addendum)
Pt to department via EMS with hypotension.  Very lethargic upon arrival, but does arouse to verbal stimuli and follows command. Per pt, she took 50mg  benadryl and a muscle relaxer (Metaxalone) earlier tonight, in addition to regular prescribed medications. Pt denies taking pain medications other than ibuprofen.  Pt denies any thoughts of self harm.

## 2015-06-02 NOTE — H&P (Signed)
[facility] History and Physical  Ruth [name] UUV:[id number] DOB: [birth year] DOA: 06/02/2015  Referring physician: ER PCP: Jobe Marker   Chief Complaint: Hypotension, altered mental status  HPI: Ruth [name] is a 40 y.o. female  This is a 40 year old lady who suffers from PTSD. She takes prazosin as well of her medications. This evening approximately 6:30 PM, she apparently took a combination of Benadryl, metaxalone(muscle relaxant) as well as prazosin. This was not an intentional overdose. Apparently she read an upsetting post about her mother on Facebook. This might of triggered her taking the prazosin. Her daughter found her drowsy/unresponsive with slurred speech. The patient has also had left foot injury in the last couple weeks. She has been fairly immobile. Evaluation in the emergency room showed her to have no evidence of pulmonary embolism but she was hypotensive, somewhat dehydrated and also strangely, hypothermic. After a degree of fluid resuscitation, she is alert and able to give a history. She remains hypothermic and is under a warming blanket. She is now being admitted for further investigation and management.   Review of Systems:  Apart from symptoms above, all systems are negative..   Past Medical History  Diagnosis Date  . Diabetes mellitus without complication (HCC)   . DJD (degenerative joint disease)   . AC (acromioclavicular) joint bone spurs   . ADHD (attention deficit hyperactivity disorder)   . Anxiety   . Depression   . Chronic back pain    Past Surgical History  Procedure Laterality Date  . Cholecystectomy    . Tubual ligation    . Tubal ligation     Social History:  reports that she has quit smoking. Her smoking use included Cigarettes. She smoked 0.50 packs per day. She does not have any smokeless tobacco history on file. She reports that she does not drink alcohol or use illicit drugs.  No Known Allergies  Family History  Problem  Relation Age of Onset  . Bipolar disorder Mother   . Drug abuse Brother   . ADD / ADHD Son       Prior to Admission medications   Medication Sig Start Date End Date Taking? Authorizing Provider  clobetasol (OLUX) 0.05 % topical foam Apply 1 application topically daily.   Yes Historical Provider, MD  clobetasol cream (TEMOVATE) 0.05 % Apply 1 application topically 2 (two) times daily as needed.   Yes Historical Provider, MD  diazepam (VALIUM) 10 MG tablet Take 1 tablet (10 mg total) by mouth 2 (two) times daily. Patient taking differently: Take 10 mg by mouth 2 (two) times daily as needed for anxiety.  04/01/15  Yes Myrlene Broker, MD  diphenhydrAMINE (BENADRYL) 25 MG tablet Take 50 mg by mouth at bedtime.   Yes Historical Provider, MD  DULoxetine (CYMBALTA) 30 MG capsule Take 30 mg by mouth daily.   Yes Historical Provider, MD  ibuprofen (ADVIL,MOTRIN) 200 MG tablet Take 400 mg by mouth every 6 (six) hours as needed.    Yes Historical Provider, MD  Insulin Glargine (LANTUS SOLOSTAR) 100 UNIT/ML Solostar Pen Inject 20 Units into the skin daily.    Yes Historical Provider, MD  lisdexamfetamine (VYVANSE) 50 MG capsule Take 1 capsule (50 mg total) by mouth daily. 04/01/15  Yes Myrlene Broker, MD  losartan (COZAAR) 25 MG tablet Take 25 mg by mouth daily.   Yes Historical Provider, MD  metaxalone (SKELAXIN) 800 MG tablet Take 800 mg by mouth 3 (three) times daily as needed for muscle spasms.  Yes Historical Provider, MD  prazosin (MINIPRESS) 5 MG capsule Take 1 capsule (5 mg total) by mouth at bedtime. 04/23/15  Yes Myrlene Brokereborah R Ross, MD  simvastatin (ZOCOR) 20 MG tablet Take 20 mg by mouth daily.   Yes Historical Provider, MD  sitaGLIPtin-metformin (JANUMET) 50-1000 MG per tablet Take 1 tablet by mouth 2 (two) times daily with a meal.   Yes Historical Provider, MD  acetaminophen-codeine (TYLENOL #3) 300-30 MG tablet Take 1-2 tablets by mouth every 6 (six) hours as needed for moderate pain. 04/06/15    Ivery QualeHobson Bryant, PA-C  lisdexamfetamine (VYVANSE) 50 MG capsule Take 1 capsule (50 mg total) by mouth daily. 04/01/15   Myrlene Brokereborah R Ross, MD  lisdexamfetamine (VYVANSE) 50 MG capsule Take 1 capsule (50 mg total) by mouth daily. 04/01/15   Myrlene Brokereborah R Ross, MD  ondansetron (ZOFRAN) 8 MG tablet Take 1 tablet (8 mg total) by mouth every 8 (eight) hours as needed for nausea or vomiting. 02/08/15   Tammy Triplett, PA-C  oxyCODONE-acetaminophen (PERCOCET/ROXICET) 5-325 MG tablet Take 1 tablet by mouth every 4 (four) hours as needed. 05/22/15   Burgess AmorJulie Idol, PA-C   Physical Exam: Filed Vitals:   06/02/15 2115 06/02/15 2130 06/02/15 2145 06/02/15 2200  BP:  103/64  95/59  Pulse: 100 90 112 106  Temp: 95 F (35 C) 94.8 F (34.9 C) 94.8 F (34.9 C) 95 F (35 C)  TempSrc:      Resp:  14 21 30   Height:      Weight:      SpO2: 100% 98% 100% 99%    Wt Readings from Last 3 Encounters:  06/02/15 87.544 kg (193 lb)  05/22/15 87.544 kg (193 lb)  04/06/15 88.451 kg (195 lb)    General:  Appears alert and oriented. Blood pressure still soft but peripheral pulses are bounding. She is hypothermic under a warming blanket. Eyes: PERRL, normal lids, irises & conjunctiva ENT: grossly normal hearing, lips & tongue Neck: no LAD, masses or thyromegaly Cardiovascular: RRR, no m/r/g. No LE edema. Telemetry: SR, no arrhythmias  Respiratory: CTA bilaterally, no w/r/r. Normal respiratory effort. Abdomen: soft, ntnd Skin: no rash or induration seen on limited exam Musculoskeletal: grossly normal tone BUE/BLE Psychiatric: grossly normal mood and affect, speech fluent and appropriate Neurologic: grossly non-focal.          Labs on Admission:  Basic Metabolic Panel:  Recent Labs Lab 06/02/15 2000  NA 133*  K 3.8  CL 101  CO2 16*  GLUCOSE 245*  BUN 19  CREATININE 1.25*  CALCIUM 9.8   Liver Function Tests:  Recent Labs Lab 06/02/15 2000  AST 29  ALT 22  ALKPHOS 45  BILITOT 1.0  PROT 8.6*  ALBUMIN  5.0   No results for input(s): LIPASE, AMYLASE in the last 168 hours. No results for input(s): AMMONIA in the last 168 hours. CBC:  Recent Labs Lab 06/02/15 2000  WBC 9.8  NEUTROABS 6.8  HGB 14.5  HCT 43.2  MCV 94.3  PLT 199   Cardiac Enzymes: No results for input(s): CKTOTAL, CKMB, CKMBINDEX, TROPONINI in the last 168 hours.  BNP (last 3 results) No results for input(s): BNP in the last 8760 hours.  ProBNP (last 3 results) No results for input(s): PROBNP in the last 8760 hours.  CBG: No results for input(s): GLUCAP in the last 168 hours.  Radiological Exams on Admission: Ct Head Wo Contrast  06/02/2015  CLINICAL DATA:  Acute onset of syncope and fall in bathroom. Hypotension  and slurred speech. Altered mental status. Concern for cervical spine injury. Initial encounter. EXAM: CT HEAD WITHOUT CONTRAST CT CERVICAL SPINE WITHOUT CONTRAST TECHNIQUE: Multidetector CT imaging of the head and cervical spine was performed following the standard protocol without intravenous contrast. Multiplanar CT image reconstructions of the cervical spine were also generated. COMPARISON:  None. FINDINGS: CT HEAD FINDINGS There is no evidence of acute infarction, mass lesion, or intra- or extra-axial hemorrhage on CT. The posterior fossa, including the cerebellum, brainstem and fourth ventricle, is within normal limits. The third and lateral ventricles, and basal ganglia are unremarkable in appearance. The cerebral hemispheres are symmetric in appearance, with normal gray-white differentiation. No mass effect or midline shift is seen. There is no evidence of fracture; visualized osseous structures are unremarkable in appearance. The orbits are within normal limits. The paranasal sinuses and mastoid air cells are well-aerated. No significant soft tissue abnormalities are seen. CT CERVICAL SPINE FINDINGS There is no evidence of fracture or subluxation. Vertebral bodies demonstrate normal height and alignment.  Intervertebral disc spaces are preserved. Prevertebral soft tissues are within normal limits. The visualized neural foramina are grossly unremarkable. Minimal vague hypodensity is noted within the left thyroid lobe. The minimally visualized lung apices are clear. No significant soft tissue abnormalities are seen. IMPRESSION: 1. No evidence of traumatic intracranial injury or fracture. 2. No evidence of fracture or subluxation along the cervical spine. 3. Minimal vague hypodensity within the left thyroid lobe is likely benign. No dominant mass seen. Electronically Signed   By: Roanna Raider M.D.   On: 06/02/2015 21:53   Ct Angio Chest Pe W/cm &/or Wo Cm  06/02/2015  CLINICAL DATA:  40 year old female with syncope and altered mental status. EXAM: CT ANGIOGRAPHY CHEST WITH CONTRAST TECHNIQUE: Multidetector CT imaging of the chest was performed using the standard protocol during bolus administration of intravenous contrast. Multiplanar CT image reconstructions and MIPs were obtained to evaluate the vascular anatomy. CONTRAST:  OMNIPAQUE IOHEXOL 350 MG/ML SOLN COMPARISON:  Chest radiograph 03/22/2014 FINDINGS: Minimal bibasilar dependent atelectatic change. The lungs are otherwise clear. There is no pleural effusion or pneumothorax. The central airways are patent. The thoracic aorta appears unremarkable. No CT evidence of pulmonary embolus. No cardiomegaly or pericardial effusion. There is no hilar or mediastinal adenopathy. The esophagus is grossly unremarkable. There is a subcentimeter left thyroid hypodense nodule. Ultrasound may provide better evaluation. There is no axillary adenopathy. The chest wall soft tissues appear unremarkable. The osseous structures are intact. The visualized upper abdomen is unremarkable. Review of the MIP images confirms the above findings. IMPRESSION: No CT evidence of pulmonary embolism. Electronically Signed   By: Elgie Collard M.D.   On: 06/02/2015 21:57   Ct Cervical  Spine Wo Contrast  06/02/2015  CLINICAL DATA:  Acute onset of syncope and fall in bathroom. Hypotension and slurred speech. Altered mental status. Concern for cervical spine injury. Initial encounter. EXAM: CT HEAD WITHOUT CONTRAST CT CERVICAL SPINE WITHOUT CONTRAST TECHNIQUE: Multidetector CT imaging of the head and cervical spine was performed following the standard protocol without intravenous contrast. Multiplanar CT image reconstructions of the cervical spine were also generated. COMPARISON:  None. FINDINGS: CT HEAD FINDINGS There is no evidence of acute infarction, mass lesion, or intra- or extra-axial hemorrhage on CT. The posterior fossa, including the cerebellum, brainstem and fourth ventricle, is within normal limits. The third and lateral ventricles, and basal ganglia are unremarkable in appearance. The cerebral hemispheres are symmetric in appearance, with normal gray-white differentiation. No mass  effect or midline shift is seen. There is no evidence of fracture; visualized osseous structures are unremarkable in appearance. The orbits are within normal limits. The paranasal sinuses and mastoid air cells are well-aerated. No significant soft tissue abnormalities are seen. CT CERVICAL SPINE FINDINGS There is no evidence of fracture or subluxation. Vertebral bodies demonstrate normal height and alignment. Intervertebral disc spaces are preserved. Prevertebral soft tissues are within normal limits. The visualized neural foramina are grossly unremarkable. Minimal vague hypodensity is noted within the left thyroid lobe. The minimally visualized lung apices are clear. No significant soft tissue abnormalities are seen. IMPRESSION: 1. No evidence of traumatic intracranial injury or fracture. 2. No evidence of fracture or subluxation along the cervical spine. 3. Minimal vague hypodensity within the left thyroid lobe is likely benign. No dominant mass seen. Electronically Signed   By: Roanna Raider M.D.   On:  06/02/2015 21:53    EKG: Independently revieweSinus rhythm without any acute ST-T wave changes.  Assesment/Plan    1. Hypotension. I think this is from a combination of Benadryl, muscle relaxant and prazosin. She also has a degree of dehydration and is in acute renal failure. She will be given fairly aggressive IV fluids and monitor closely. I will obtain urine drug screen. 2. Hypothermia. The etiology is unclear. Her white count is not elevated, her lactic acid is not elevated. However, I will treat her with empirical intravenous antibiotics in case she has sepsis, although clinically I do not think she does. 3. Diabetes. Continue with home medications and sliding scale of insulin. Her bicarbonate is reduced and anion gap is slightly increased but I'm not convinced that she is in true DKA. We will monitor this closely.   She'll be admitted to the stepdown unit. Further recommendations will depend on patient's hospital progress  Code Status: Full code.    DVT Prophylaxis: Lovenox.   Family Communication: I discussed the plan with the patient at the bedside.    Disposition Plan: home when medically stable.    Time spent: 60 minutes.   Wilson Singer [facility] Pager [phone].

## 2015-06-02 NOTE — ED Provider Notes (Signed)
CSN: [id number]     Arrival date & time 06/02/15  1958 History  By signing my name below, I, Murriel Hopper, attest that this documentation has been prepared under the direction and in the presence of Eber Hong, MD. Electronically Signed: Murriel Hopper, ED Scribe. 06/02/2015. 8:24 PM.    Chief Complaint  Patient presents with  . Hypotension  . Altered Mental Status    The history is provided by the patient. No language interpreter was used.   HPI Comments: Ruth [name] is a 40 y.o. female with a hx of depression, anxiety who presents to the Emergency Department complaining of a single episode of syncope after taking a muscle relaxer and 50 mg benadryl and then falling in her bathroom PTA. Pt hypotensive with associated slurred speech and altered mental status PTA. Pt states her muscle relaxer is Metaxalone, and states she has only taken it a few times, but has never experienced drowsiness like she did tonight. Her daughter states she found pt on the ground laying on her side, and reports she was semi-conscious and had trouble breathing when pt was found. Pt responded with slurred speech when asked questions. Pt reports before taking Benadryl and the muscle relaxer that she had had a anxiety and anger producing episode where she had read an upsetting post about her mother on facebook by a "former sister".  She states that her PTSD surround her mother as she took care of her in her dying days. Pt also reports breaking her left foot within the past couple of weeks as well, and reports swelling and pain to the area. Pt denies seizures, headache, vomiting, diarrhea.     Past Medical History  Diagnosis Date  . Diabetes mellitus without complication (HCC)   . DJD (degenerative joint disease)   . AC (acromioclavicular) joint bone spurs   . ADHD (attention deficit hyperactivity disorder)   . Anxiety   . Depression   . Chronic back pain    Past Surgical History  Procedure Laterality Date  .  Cholecystectomy    . Tubual ligation    . Tubal ligation     Family History  Problem Relation Age of Onset  . Bipolar disorder Mother   . Drug abuse Brother   . ADD / ADHD Son    Social History  Substance Use Topics  . Smoking status: Former Smoker -- 0.50 packs/day    Types: Cigarettes  . Smokeless tobacco: None  . Alcohol Use: No   OB History    No data available     Review of Systems  A complete 10 system review of systems was obtained and all systems are negative except as noted in the HPI and PMH.    Allergies  Review of patient's allergies indicates no known allergies.  Home Medications   Prior to Admission medications   Medication Sig Start Date End Date Taking? Authorizing Provider  clobetasol (OLUX) 0.05 % topical foam Apply 1 application topically daily.   Yes Historical Provider, MD  clobetasol cream (TEMOVATE) 0.05 % Apply 1 application topically 2 (two) times daily as needed.   Yes Historical Provider, MD  diazepam (VALIUM) 10 MG tablet Take 1 tablet (10 mg total) by mouth 2 (two) times daily. Patient taking differently: Take 10 mg by mouth 2 (two) times daily as needed for anxiety.  04/01/15  Yes Myrlene Broker, MD  diphenhydrAMINE (BENADRYL) 25 MG tablet Take 50 mg by mouth at bedtime.   Yes Historical Provider, MD  DULoxetine (CYMBALTA) 30 MG capsule Take 30 mg by mouth daily.   Yes Historical Provider, MD  ibuprofen (ADVIL,MOTRIN) 200 MG tablet Take 400 mg by mouth every 6 (six) hours as needed.    Yes Historical Provider, MD  Insulin Glargine (LANTUS SOLOSTAR) 100 UNIT/ML Solostar Pen Inject 20 Units into the skin daily.    Yes Historical Provider, MD  lisdexamfetamine (VYVANSE) 50 MG capsule Take 1 capsule (50 mg total) by mouth daily. 04/01/15  Yes Myrlene Broker, MD  losartan (COZAAR) 25 MG tablet Take 25 mg by mouth daily.   Yes Historical Provider, MD  metaxalone (SKELAXIN) 800 MG tablet Take 800 mg by mouth 3 (three) times daily as needed for muscle  spasms.   Yes Historical Provider, MD  prazosin (MINIPRESS) 5 MG capsule Take 1 capsule (5 mg total) by mouth at bedtime. 04/23/15  Yes Myrlene Broker, MD  simvastatin (ZOCOR) 20 MG tablet Take 20 mg by mouth daily.   Yes Historical Provider, MD  sitaGLIPtin-metformin (JANUMET) 50-1000 MG per tablet Take 1 tablet by mouth 2 (two) times daily with a meal.   Yes Historical Provider, MD  acetaminophen-codeine (TYLENOL #3) 300-30 MG tablet Take 1-2 tablets by mouth every 6 (six) hours as needed for moderate pain. 04/06/15   Ivery Quale, PA-C  lisdexamfetamine (VYVANSE) 50 MG capsule Take 1 capsule (50 mg total) by mouth daily. 04/01/15   Myrlene Broker, MD  lisdexamfetamine (VYVANSE) 50 MG capsule Take 1 capsule (50 mg total) by mouth daily. 04/01/15   Myrlene Broker, MD  ondansetron (ZOFRAN) 8 MG tablet Take 1 tablet (8 mg total) by mouth every 8 (eight) hours as needed for nausea or vomiting. 02/08/15   Tammy Triplett, PA-C  oxyCODONE-acetaminophen (PERCOCET/ROXICET) 5-325 MG tablet Take 1 tablet by mouth every 4 (four) hours as needed. 05/22/15   Burgess Amor, PA-C   BP 74/47 mmHg  Pulse 100  Temp(Src) 95 F (35 C) (Rectal)  Resp 19  Ht 5\' 4"  (1.626 m)  Wt 193 lb (87.544 kg)  BMI 33.11 kg/m2  SpO2 100% Physical Exam  Constitutional: She appears distressed.  Pale No peripheral pulses Normal central pulses Awake and able to talk but completely intoxicated  HENT:  Head: Normocephalic and atraumatic.  Mouth/Throat: Oropharynx is clear and moist. No oropharyngeal exudate.  Dry MM, pale  Eyes: Conjunctivae and EOM are normal. Pupils are equal, round, and reactive to light. Right eye exhibits no discharge. Left eye exhibits no discharge. No scleral icterus.  Pupils 4 mm and sluggish  Neck: Normal range of motion. Neck supple. No JVD present. No thyromegaly present.  Cardiovascular: Normal rate, regular rhythm, normal heart sounds and intact distal pulses.  Exam reveals no gallop and no friction  rub.   No murmur heard. Palpable carotid, thready radial arterial pulses  Pulmonary/Chest: Effort normal and breath sounds normal. No respiratory distress. She has no wheezes. She has no rales.  Abdominal: Soft. Bowel sounds are normal. She exhibits no distension and no mass. There is no tenderness.  Musculoskeletal: Normal range of motion. She exhibits tenderness ( ttp in the L foot with assocaietd bruising). She exhibits no edema.  Lymphadenopathy:    She has no cervical adenopathy.  Neurological: She is alert. Coordination normal.  Mild somnolence but follows commands.  MAEX4, memory intact  Skin: Skin is warm and dry. No rash noted. She is not diaphoretic. No erythema. There is pallor.  Psychiatric: She has a normal mood and affect. Her behavior  is normal.  Nursing note and vitals reviewed.   ED Course  Procedures (including critical care time)  DIAGNOSTIC STUDIES: Oxygen Saturation is 97% on room air, normal by my interpretation.    COORDINATION OF CARE: 8:23 PM Discussed treatment plan with pt at bedside and pt agreed to plan.   Labs Review Labs Reviewed  COMPREHENSIVE METABOLIC PANEL - Abnormal; Notable for the following:    Sodium 133 (*)    CO2 16 (*)    Glucose, Bld 245 (*)    Creatinine, Ser 1.25 (*)    Total Protein 8.6 (*)    GFR calc non Af Amer 54 (*)    Anion gap 16 (*)    All other components within normal limits  ETHANOL  CBC WITH DIFFERENTIAL/PLATELET  URINE RAPID DRUG SCREEN, HOSP PERFORMED  URINALYSIS, ROUTINE W REFLEX MICROSCOPIC (NOT [facility])  I-STAT CG4 LACTIC ACID, ED    Imaging Review No results found. I have personally reviewed and evaluated these images and lab results as part of my medical decision-making.   EKG Interpretation   Date/Time:  Sunday June 02 2015 20:03:03 EST Ventricular Rate:  95 PR Interval:  124 QRS Duration: 96 QT Interval:  394 QTC Calculation: 495 R Axis:   32 Text Interpretation:  Sinus rhythm Abnormal  R-wave progression, early  transition Borderline prolonged QT interval No old tracing to compare  Confirmed by Ross Bender  MD, Mylinh Cragg ([id number]) on 06/02/2015 8:10:44 PM      MDM   Final diagnoses:  Shock (HCC)  Oliguria  Overdose, undetermined intent, initial encounter  Hypothermia, initial encounter   The pt has risk for dehydradtion - she is persistent hypotensive, she is hypothermic and she had syncope with ongoing sx - has ongoing pain in her foot and with recent foot injury and immobilization is at risk for PE.    Labs show Cr that is slightly elevated and BUN that is up - she is oliguric also suggestive of dehdyration as a source.  Unknown if pt hit her head / neck - she has mild neck pain - CT head and C spine pending - r/o PE with CT angio.  Warming with blankets / Bare Hugger,  Fluids for rescucitation - pt improved slightly - will need admission.   persistesnt hypotension but slightly improved with fluids Persistent hypothermia requiring ongonig warming.  Filed Vitals:   06/02/15 2034 06/02/15 2045 06/02/15 2100 06/02/15 2115  BP:      Pulse:  95  100  Temp: 95.7 F (35.4 C) 90.3 F (32.4 C) 94.6 F (34.8 C) 95 F (35 C)  TempSrc: Rectal     Resp:  19    Height:      Weight:      SpO2:  99%  100%   CRITICAL CARE Performed by: Vida RollerMILLER,Montrae Braithwaite D Total critical care time: 35 minutes Critical care time was exclusive of separately billable procedures and treating other patients. Critical care was necessary to treat or prevent imminent or life-threatening deterioration. Critical care was time spent personally by me on the following activities: development of treatment plan with patient and/or surrogate as well as nursing, discussions with consultants, evaluation of patient's response to treatment, examination of patient, obtaining history from patient or surrogate, ordering and performing treatments and interventions, ordering and review of laboratory studies, ordering and  review of radiographic studies, pulse oximetry and re-evaluation of patient's condition.   D/w Dr. Karilyn CotaGosrani who will admit to Step Down  Meds given in ED:  Medications  sodium chloride 0.9 % bolus 1,000 mL (not administered)  sodium chloride 0.9 % bolus 1,000 mL (1,000 mLs Intravenous New Bag/Given 06/02/15 2048)  sodium chloride 0.9 % bolus 1,000 mL (1,000 mLs Intravenous New Bag/Given 06/02/15 2048)  iohexol (OMNIPAQUE) 350 MG/ML injection 100 mL (100 mLs Intravenous Contrast Given 06/02/15 2112)       Eber Hong, MD 06/02/15 2150

## 2015-06-02 NOTE — ED Notes (Signed)
Blanket warmer applied

## 2015-06-02 NOTE — ED Notes (Signed)
EKG done and given to Dr Miller 

## 2015-06-03 DIAGNOSIS — T68XXXD Hypothermia, subsequent encounter: Secondary | ICD-10-CM

## 2015-06-03 DIAGNOSIS — F900 Attention-deficit hyperactivity disorder, predominantly inattentive type: Secondary | ICD-10-CM

## 2015-06-03 DIAGNOSIS — T50905S Adverse effect of unspecified drugs, medicaments and biological substances, sequela: Secondary | ICD-10-CM

## 2015-06-03 DIAGNOSIS — I952 Hypotension due to drugs: Secondary | ICD-10-CM

## 2015-06-03 LAB — CBC
HCT: 34.4 % — ABNORMAL LOW (ref 36.0–46.0)
Hemoglobin: 11.9 g/dL — ABNORMAL LOW (ref 12.0–15.0)
MCH: 32.4 pg (ref 26.0–34.0)
MCHC: 34.6 g/dL (ref 30.0–36.0)
MCV: 93.7 fL (ref 78.0–100.0)
PLATELETS: 133 10*3/uL — AB (ref 150–400)
RBC: 3.67 MIL/uL — ABNORMAL LOW (ref 3.87–5.11)
RDW: 13.1 % (ref 11.5–15.5)
WBC: 6.3 10*3/uL (ref 4.0–10.5)

## 2015-06-03 LAB — COMPREHENSIVE METABOLIC PANEL
ALK PHOS: 35 U/L — AB (ref 38–126)
ALT: 18 U/L (ref 14–54)
AST: 18 U/L (ref 15–41)
Albumin: 3.8 g/dL (ref 3.5–5.0)
Anion gap: 10 (ref 5–15)
BUN: 15 mg/dL (ref 6–20)
CALCIUM: 8.4 mg/dL — AB (ref 8.9–10.3)
CHLORIDE: 112 mmol/L — AB (ref 101–111)
CO2: 17 mmol/L — AB (ref 22–32)
CREATININE: 0.68 mg/dL (ref 0.44–1.00)
Glucose, Bld: 126 mg/dL — ABNORMAL HIGH (ref 65–99)
Potassium: 3.8 mmol/L (ref 3.5–5.1)
SODIUM: 139 mmol/L (ref 135–145)
Total Bilirubin: 0.5 mg/dL (ref 0.3–1.2)
Total Protein: 6.7 g/dL (ref 6.5–8.1)

## 2015-06-03 LAB — GLUCOSE, CAPILLARY
GLUCOSE-CAPILLARY: 98 mg/dL (ref 65–99)
GLUCOSE-CAPILLARY: 95 mg/dL (ref 65–99)
Glucose-Capillary: 175 mg/dL — ABNORMAL HIGH (ref 65–99)

## 2015-06-03 LAB — MRSA PCR SCREENING: MRSA BY PCR: NEGATIVE

## 2015-06-03 MED ORDER — SALINE SPRAY 0.65 % NA SOLN
NASAL | Status: AC
Start: 2015-06-03 — End: 2015-06-03
  Filled 2015-06-03: qty 44

## 2015-06-03 MED ORDER — OXYCODONE HCL 5 MG PO TABS
5.0000 mg | ORAL_TABLET | Freq: Once | ORAL | Status: AC
  Administered 2015-06-03: 5 mg via ORAL

## 2015-06-03 MED ORDER — CLOBETASOL PROPIONATE 0.05 % EX OINT
TOPICAL_OINTMENT | Freq: Two times a day (BID) | CUTANEOUS | Status: DC | PRN
  Filled 2015-06-03: qty 15

## 2015-06-03 MED ORDER — MORPHINE SULFATE (PF) 2 MG/ML IV SOLN
2.0000 mg | INTRAVENOUS | Status: AC
  Administered 2015-06-03: 2 mg via INTRAVENOUS
  Filled 2015-06-03: qty 1

## 2015-06-03 NOTE — Progress Notes (Signed)
Pt a/o.vss. Saline lock removed. No complaints of any distress. Discharge instructions given. Pt verbalized understanding of instructions. Pt left floor via wheelchair with nursing staff.

## 2015-06-03 NOTE — Discharge Summary (Signed)
Discharge Summary  Ruth [name] ZOX:[id number] DOB: 29-Mar-1976  PCP: Ruth [name]  Admit date: 06/02/2015 Discharge date: 06/03/2015  Time spent: 25 minutes   Recommendations for Outpatient Follow-up:  1. Patient advised to stop her muscle relaxers 2. She'll follow-up with her PCP as needed   Discharge Diagnoses:  Active Hospital Problems   Diagnosis Date Noted  . Hypothermia 06/02/2015  . Hypotension 06/02/2015  . Diabetes (HCC) 06/02/2015  . 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    Discharge Condition: Improved, being discharged home   Diet recommendation: Carb modified   Filed Weights   06/02/15 2006 06/02/15 2316  Weight: 87.544 kg (193 lb) 88.7 kg (195 lb 8.8 oz)    History of present illness:  Patient is a 40 year old female with past mental history of ADHD and recent left ankle injury who presented to the hospital after feeling quite drowsy and calling 911. Patient felt very stressed and had a difficult time sleeping so she took a new sleep medicine that works well and PTSD, but also along with the muscle relaxer and narcotic medication which made her quite groggy and unable to stay awake. She started feeling of very heaviness sensation in her chest became quite frightened and called paramedics. In the emergency room, vital signs were overall stable Other than patient noted to be hypothermic and slightly hypotensive. Patient started on IV fluids and monitored in stepdown unit.   Hospital Course:  Active Problems:   ADHD (attention deficit hyperactivity disorder), inattentive type: Continue Vyvanse    Diabetes (HCC): Blood sugar stable.  Medication interaction causing hypothermia and hypotension: By following day, vital signs stable. Blood pressure stable. Patient awake and alert. Felt that this was a medication  interaction and advised her to discontinue her muscle relaxers and really try to avoid mixing medications. She says she will indeed do so. We had extensive discussion and I found no evidence nor any inkling that she was trying to intentionally do this or hurt herself. Felt to be medically stable for discharge and discharged home. She did request a prescription for narcotics which I told her she needs to get through her orthopedist. She may have some component of narcotic seeking  Procedures: None  Consultations:  None    Discharge Exam: BP 143/93 mmHg  Pulse 97  Temp(Src) 98.1 F (36.7 C) (Core (Comment))  Resp 19  Ht 5\' 4"  (1.626 m)  Wt 88.7 kg (195 lb 8.8 oz)  BMI 33.55 kg/m2  SpO2 98%  General: Alert and oriented 3   Cardiovascular: Regular rate and rhythm   Respiratory: Clear to auscultation bilaterally    Discharge Instructions You were cared for by a hospitalist during your hospital stay. If you have any questions about your discharge medications or the care you received while you were in the hospital after you are discharged, you can call the unit and asked to speak with the hospitalist on call if the hospitalist that took care of you is not available. Once you are discharged, your primary care physician will handle any further medical issues. Please note that NO REFILLS for any discharge medications will be authorized once you are discharged, as it is imperative that you return to your primary care physician (or establish a relationship with a primary care physician if you do not have one) for your aftercare needs so that they can reassess your  need for medications and monitor your lab values.  Discharge Instructions    Diet - low sodium heart healthy    Complete by:  As directed      Diet Carb Modified    Complete by:  As directed      Increase activity slowly    Complete by:  As directed             Medication List    STOP taking these medications         acetaminophen-codeine 300-30 MG tablet  Commonly known as:  TYLENOL #3     metaxalone 800 MG tablet  Commonly known as:  SKELAXIN      TAKE these medications        clobetasol 0.05 % topical foam  Commonly known as:  OLUX  Apply 1 application topically daily.     clobetasol cream 0.05 %  Commonly known as:  TEMOVATE  Apply 1 application topically 2 (two) times daily as needed.     diazepam 10 MG tablet  Commonly known as:  VALIUM  Take 1 tablet (10 mg total) by mouth 2 (two) times daily.     diphenhydrAMINE 25 MG tablet  Commonly known as:  BENADRYL  Take 50 mg by mouth at bedtime.     DULoxetine 30 MG capsule  Commonly known as:  CYMBALTA  Take 30 mg by mouth daily.     ibuprofen 200 MG tablet  Commonly known as:  ADVIL,MOTRIN  Take 400 mg by mouth every 6 (six) hours as needed.     LANTUS SOLOSTAR 100 UNIT/ML Solostar Pen  Generic drug:  Insulin Glargine  Inject 20 Units into the skin daily.     lisdexamfetamine 50 MG capsule  Commonly known as:  VYVANSE  Take 1 capsule (50 mg total) by mouth daily.     losartan 25 MG tablet  Commonly known as:  COZAAR  Take 25 mg by mouth daily.     ondansetron 8 MG tablet  Commonly known as:  ZOFRAN  Take 1 tablet (8 mg total) by mouth every 8 (eight) hours as needed for nausea or vomiting.     oxyCODONE-acetaminophen 5-325 MG tablet  Commonly known as:  PERCOCET/ROXICET  Take 1 tablet by mouth every 4 (four) hours as needed.     prazosin 5 MG capsule  Commonly known as:  MINIPRESS  Take 1 capsule (5 mg total) by mouth at bedtime.     simvastatin 20 MG tablet  Commonly known as:  ZOCOR  Take 20 mg by mouth daily.     sitaGLIPtin-metformin 50-1000 MG tablet  Commonly known as:  JANUMET  Take 1 tablet by mouth 2 (two) times daily with a meal.       No Known Allergies    The results of significant diagnostics from this hospitalization (including imaging, microbiology, ancillary and laboratory) are listed below  for reference.    Significant Diagnostic Studies: Dg Ankle Complete Left  05-Jun-2015  CLINICAL DATA:  Fall from bed this morning with left ankle injury. Initial encounter. EXAM: LEFT ANKLE COMPLETE - 3+ VIEW COMPARISON:  None. FINDINGS: Marked soft tissue swelling overlies a coronally oblique fracture through the distal fibula, with lower margin at the level of the ankle joint. The fracture is displaced laterally and posteriorly by 2 mm. No medial fracture or ankle mortise widening. IMPRESSION: Mildly displaced distal fibula fracture. Electronically Signed   By: Marnee Spring M.D.   On: 06/05/15 12:50  Ct Head Wo Contrast  06/02/2015  CLINICAL DATA:  Acute onset of syncope and fall in bathroom. Hypotension and slurred speech. Altered mental status. Concern for cervical spine injury. Initial encounter. EXAM: CT HEAD WITHOUT CONTRAST CT CERVICAL SPINE WITHOUT CONTRAST TECHNIQUE: Multidetector CT imaging of the head and cervical spine was performed following the standard protocol without intravenous contrast. Multiplanar CT image reconstructions of the cervical spine were also generated. COMPARISON:  None. FINDINGS: CT HEAD FINDINGS There is no evidence of acute infarction, mass lesion, or intra- or extra-axial hemorrhage on CT. The posterior fossa, including the cerebellum, brainstem and fourth ventricle, is within normal limits. The third and lateral ventricles, and basal ganglia are unremarkable in appearance. The cerebral hemispheres are symmetric in appearance, with normal gray-white differentiation. No mass effect or midline shift is seen. There is no evidence of fracture; visualized osseous structures are unremarkable in appearance. The orbits are within normal limits. The paranasal sinuses and mastoid air cells are well-aerated. No significant soft tissue abnormalities are seen. CT CERVICAL SPINE FINDINGS There is no evidence of fracture or subluxation. Vertebral bodies demonstrate normal height and  alignment. Intervertebral disc spaces are preserved. Prevertebral soft tissues are within normal limits. The visualized neural foramina are grossly unremarkable. Minimal vague hypodensity is noted within the left thyroid lobe. The minimally visualized lung apices are clear. No significant soft tissue abnormalities are seen. IMPRESSION: 1. No evidence of traumatic intracranial injury or fracture. 2. No evidence of fracture or subluxation along the cervical spine. 3. Minimal vague hypodensity within the left thyroid lobe is likely benign. No dominant mass seen. Electronically Signed   By: Roanna Raider M.D.   On: 06/02/2015 21:53   Ct Angio Chest Pe W/cm &/or Wo Cm  06/02/2015  CLINICAL DATA:  40 year old female with syncope and altered mental status. EXAM: CT ANGIOGRAPHY CHEST WITH CONTRAST TECHNIQUE: Multidetector CT imaging of the chest was performed using the standard protocol during bolus administration of intravenous contrast. Multiplanar CT image reconstructions and MIPs were obtained to evaluate the vascular anatomy. CONTRAST:  OMNIPAQUE IOHEXOL 350 MG/ML SOLN COMPARISON:  Chest radiograph 03/22/2014 FINDINGS: Minimal bibasilar dependent atelectatic change. The lungs are otherwise clear. There is no pleural effusion or pneumothorax. The central airways are patent. The thoracic aorta appears unremarkable. No CT evidence of pulmonary embolus. No cardiomegaly or pericardial effusion. There is no hilar or mediastinal adenopathy. The esophagus is grossly unremarkable. There is a subcentimeter left thyroid hypodense nodule. Ultrasound may provide better evaluation. There is no axillary adenopathy. The chest wall soft tissues appear unremarkable. The osseous structures are intact. The visualized upper abdomen is unremarkable. Review of the MIP images confirms the above findings. IMPRESSION: No CT evidence of pulmonary embolism. Electronically Signed   By: Elgie Collard M.D.   On: 06/02/2015 21:57   Ct  Cervical Spine Wo Contrast  06/02/2015  CLINICAL DATA:  Acute onset of syncope and fall in bathroom. Hypotension and slurred speech. Altered mental status. Concern for cervical spine injury. Initial encounter. EXAM: CT HEAD WITHOUT CONTRAST CT CERVICAL SPINE WITHOUT CONTRAST TECHNIQUE: Multidetector CT imaging of the head and cervical spine was performed following the standard protocol without intravenous contrast. Multiplanar CT image reconstructions of the cervical spine were also generated. COMPARISON:  None. FINDINGS: CT HEAD FINDINGS There is no evidence of acute infarction, mass lesion, or intra- or extra-axial hemorrhage on CT. The posterior fossa, including the cerebellum, brainstem and fourth ventricle, is within normal limits. The third and lateral ventricles, and  basal ganglia are unremarkable in appearance. The cerebral hemispheres are symmetric in appearance, with normal gray-white differentiation. No mass effect or midline shift is seen. There is no evidence of fracture; visualized osseous structures are unremarkable in appearance. The orbits are within normal limits. The paranasal sinuses and mastoid air cells are well-aerated. No significant soft tissue abnormalities are seen. CT CERVICAL SPINE FINDINGS There is no evidence of fracture or subluxation. Vertebral bodies demonstrate normal height and alignment. Intervertebral disc spaces are preserved. Prevertebral soft tissues are within normal limits. The visualized neural foramina are grossly unremarkable. Minimal vague hypodensity is noted within the left thyroid lobe. The minimally visualized lung apices are clear. No significant soft tissue abnormalities are seen. IMPRESSION: 1. No evidence of traumatic intracranial injury or fracture. 2. No evidence of fracture or subluxation along the cervical spine. 3. Minimal vague hypodensity within the left thyroid lobe is likely benign. No dominant mass seen. Electronically Signed   By: Roanna Raider M.D.    On: 06/02/2015 21:53    Microbiology: Recent Results (from the past 240 hour(s))  MRSA PCR Screening     Status: None   Collection Time: 06/02/15 11:55 PM  Result Value Ref Range Status   MRSA by PCR NEGATIVE NEGATIVE Final    Comment:        The GeneXpert MRSA Assay (FDA approved for NASAL specimens only), is one component of a comprehensive MRSA colonization surveillance program. It is not intended to diagnose MRSA infection nor to guide or monitor treatment for MRSA infections.      Labs: Basic Metabolic Panel:  Recent Labs Lab 06/02/15 2000 06/03/15 0437  NA 133* 139  K 3.8 3.8  CL 101 112*  CO2 16* 17*  GLUCOSE 245* 126*  BUN 19 15  CREATININE 1.25* 0.68  CALCIUM 9.8 8.4*   Liver Function Tests:  Recent Labs Lab 06/02/15 2000 06/03/15 0437  AST 29 18  ALT 22 18  ALKPHOS 45 35*  BILITOT 1.0 0.5  PROT 8.6* 6.7  ALBUMIN 5.0 3.8   No results for input(s): LIPASE, AMYLASE in the last 168 hours. No results for input(s): AMMONIA in the last 168 hours. CBC:  Recent Labs Lab 06/02/15 2000 06/03/15 0437  WBC 9.8 6.3  NEUTROABS 6.8  --   HGB 14.5 11.9*  HCT 43.2 34.4*  MCV 94.3 93.7  PLT 199 133*   Cardiac Enzymes: No results for input(s): CKTOTAL, CKMB, CKMBINDEX, TROPONINI in the last 168 hours. BNP: BNP (last 3 results) No results for input(s): BNP in the last 8760 hours.  ProBNP (last 3 results) No results for input(s): PROBNP in the last 8760 hours.  CBG:  Recent Labs Lab 06/03/15 0002 06/03/15 0738 06/03/15 1127  GLUCAP 175* 98 95       Signed:  KRISHNAN,SENDIL K  [facility] 06/03/2015, 3:17 PM

## 2015-06-04 ENCOUNTER — Encounter (HOSPITAL_COMMUNITY): Payer: Self-pay | Admitting: *Deleted

## 2015-06-04 ENCOUNTER — Telehealth (HOSPITAL_COMMUNITY): Payer: Self-pay | Admitting: *Deleted

## 2015-06-04 NOTE — Telephone Encounter (Signed)
i would like to see her at least one more time. She was just in the hospital this week for an overdose

## 2015-06-04 NOTE — Telephone Encounter (Signed)
Pt called stating she would like to cancel all further appt. Per pt, her PCP Dr. Noralyn Pick will be handing all of her medications including the meds Dr. Tenny Craw prescribed. Per pt she would like for Dr. Tenny Craw to dismissed her from this practice.

## 2015-06-04 NOTE — Telephone Encounter (Signed)
Called pt to inform her of what Dr. Tenny Craw stated and to make a f/u appt. Per pt, she will have to call office back to resch due to her having a lot of other appts coming up.

## 2015-06-17 ENCOUNTER — Ambulatory Visit (HOSPITAL_COMMUNITY): Payer: Self-pay | Admitting: Psychology

## 2015-07-01 ENCOUNTER — Ambulatory Visit (HOSPITAL_COMMUNITY): Payer: Self-pay | Admitting: Psychiatry

## 2015-07-31 ENCOUNTER — Encounter (HOSPITAL_COMMUNITY): Payer: Self-pay | Admitting: Emergency Medicine

## 2015-07-31 ENCOUNTER — Inpatient Hospital Stay (HOSPITAL_COMMUNITY)
Admission: EM | Admit: 2015-07-31 | Discharge: 2015-08-02 | DRG: 638 | Disposition: A | Discharge status: Home / Self Care | Payer: Medicaid Other | Attending: Internal Medicine | Admitting: Internal Medicine

## 2015-07-31 DIAGNOSIS — E860 Dehydration: Secondary | ICD-10-CM | POA: Diagnosis present

## 2015-07-31 DIAGNOSIS — N10 Acute tubulo-interstitial nephritis: Secondary | ICD-10-CM | POA: Diagnosis present

## 2015-07-31 DIAGNOSIS — F419 Anxiety disorder, unspecified: Secondary | ICD-10-CM | POA: Diagnosis present

## 2015-07-31 DIAGNOSIS — Z9119 Patient's noncompliance with other medical treatment and regimen: Secondary | ICD-10-CM

## 2015-07-31 DIAGNOSIS — Z87891 Personal history of nicotine dependence: Secondary | ICD-10-CM

## 2015-07-31 DIAGNOSIS — R112 Nausea with vomiting, unspecified: Secondary | ICD-10-CM

## 2015-07-31 DIAGNOSIS — Z818 Family history of other mental and behavioral disorders: Secondary | ICD-10-CM

## 2015-07-31 DIAGNOSIS — G8929 Other chronic pain: Secondary | ICD-10-CM

## 2015-07-31 DIAGNOSIS — Z794 Long term (current) use of insulin: Secondary | ICD-10-CM

## 2015-07-31 DIAGNOSIS — N390 Urinary tract infection, site not specified: Secondary | ICD-10-CM | POA: Diagnosis present

## 2015-07-31 DIAGNOSIS — M549 Dorsalgia, unspecified: Secondary | ICD-10-CM | POA: Diagnosis present

## 2015-07-31 DIAGNOSIS — E1310 Other specified diabetes mellitus with ketoacidosis without coma: Principal | ICD-10-CM | POA: Diagnosis present

## 2015-07-31 DIAGNOSIS — Z23 Encounter for immunization: Secondary | ICD-10-CM

## 2015-07-31 DIAGNOSIS — F909 Attention-deficit hyperactivity disorder, unspecified type: Secondary | ICD-10-CM | POA: Diagnosis present

## 2015-07-31 DIAGNOSIS — E1110 Type 2 diabetes mellitus with ketoacidosis without coma: Secondary | ICD-10-CM | POA: Diagnosis present

## 2015-07-31 LAB — CBC WITH DIFFERENTIAL/PLATELET
BASOS ABS: 0.1 10*3/uL (ref 0.0–0.1)
BASOS PCT: 0 %
EOS ABS: 0.2 10*3/uL (ref 0.0–0.7)
Eosinophils Relative: 2 %
HEMATOCRIT: 46.8 % — AB (ref 36.0–46.0)
Hemoglobin: 16.4 g/dL — ABNORMAL HIGH (ref 12.0–15.0)
Lymphocytes Relative: 32 %
Lymphs Abs: 4.6 10*3/uL — ABNORMAL HIGH (ref 0.7–4.0)
MCH: 32.3 pg (ref 26.0–34.0)
MCHC: 35 g/dL (ref 30.0–36.0)
MCV: 92.3 fL (ref 78.0–100.0)
MONO ABS: 0.8 10*3/uL (ref 0.1–1.0)
MONOS PCT: 6 %
NEUTROS ABS: 8.4 10*3/uL — AB (ref 1.7–7.7)
NEUTROS PCT: 60 %
Platelets: 304 10*3/uL (ref 150–400)
RBC: 5.07 MIL/uL (ref 3.87–5.11)
RDW: 13.4 % (ref 11.5–15.5)
WBC: 14.1 10*3/uL — ABNORMAL HIGH (ref 4.0–10.5)

## 2015-07-31 LAB — BASIC METABOLIC PANEL
ANION GAP: 18 — AB (ref 5–15)
BUN: 15 mg/dL (ref 6–20)
CALCIUM: 9.2 mg/dL (ref 8.9–10.3)
CO2: 16 mmol/L — ABNORMAL LOW (ref 22–32)
CREATININE: 0.75 mg/dL (ref 0.44–1.00)
Chloride: 97 mmol/L — ABNORMAL LOW (ref 101–111)
Glucose, Bld: 498 mg/dL — ABNORMAL HIGH (ref 65–99)
Potassium: 3.9 mmol/L (ref 3.5–5.1)
SODIUM: 131 mmol/L — AB (ref 135–145)

## 2015-07-31 LAB — URINALYSIS, ROUTINE W REFLEX MICROSCOPIC
BILIRUBIN URINE: NEGATIVE
Glucose, UA: >1000 mg/dL — AB
KETONES UR: 15 mg/dL — AB
Nitrite: NEGATIVE
PH: 5.5 (ref 5.0–8.0)
Specific Gravity, Urine: 1.01 (ref 1.005–1.030)

## 2015-07-31 LAB — URINE MICROSCOPIC-ADD ON

## 2015-07-31 LAB — CBG MONITORING, ED: Glucose-Capillary: 436 mg/dL — ABNORMAL HIGH (ref 65–99)

## 2015-07-31 MED ORDER — SODIUM CHLORIDE 0.9 % IV SOLN
INTRAVENOUS | Status: DC
  Filled 2015-07-31: qty 2.5

## 2015-07-31 MED ORDER — SODIUM CHLORIDE 0.9 % IV BOLUS (SEPSIS)
1000.0000 mL | Freq: Once | INTRAVENOUS | Status: AC
  Administered 2015-08-01: 1000 mL via INTRAVENOUS

## 2015-07-31 MED ORDER — ONDANSETRON HCL 4 MG/2ML IJ SOLN
4.0000 mg | Freq: Once | INTRAMUSCULAR | Status: AC
  Administered 2015-08-01: 4 mg via INTRAVENOUS
  Filled 2015-07-31: qty 2

## 2015-07-31 MED ORDER — DEXTROSE 5 % IV SOLN
1.0000 g | Freq: Once | INTRAVENOUS | Status: AC
  Administered 2015-08-01: 1 g via INTRAVENOUS
  Filled 2015-07-31: qty 10

## 2015-07-31 MED ORDER — SODIUM CHLORIDE 0.9 % IV BOLUS (SEPSIS): 1000.0000 mL | Freq: Once | INTRAVENOUS | Status: DC

## 2015-07-31 NOTE — ED Notes (Addendum)
Pt c/o blood sugars in the 600s today. Pt states she has been vomiting today.

## 2015-07-31 NOTE — ED Provider Notes (Signed)
CSN: [id number]     Arrival date & time 07/31/15  2043 History  By signing my name below, I, Ruth [name], attest that this documentation has been prepared under the direction and in the presence of Ron Parker, MD at 2332 Electronically Signed: Linus [name], ED Scribe. 08/01/2015. 11:44 PM.   Chief Complaint  Patient presents with  . Hyperglycemia   The history is provided by the patient. No language interpreter was used.   HPI Comments: Ruth [name] is a 40 y.o. female who presents to the Emergency Department with a PMHx of DM and UTI for an evaluation s/p elevated blood sugar reading today. Pt reports fever Tmax 101.6 F yesterday, nausea and vomiting x5 that started today, lightheadedness, dizziness, chronic midline back pain, new flank pain bilaterally today. She has had  difficulty urinating, dysuria, frequency, dribbling, nocturia and hematuria that started a couple of days ago. Pt reports that she usually has a blood sugar below 200 but today the glucometer read Hi. Her children have sick with URI's and strept throat last week. She was tested last week and was negative.  Pt denies chills, or any other symptoms at this time. PCP placed pt on z-pack last week which she has already finished. Pt does not smoke.  Pt is followed by [facility]  Past Medical History  Diagnosis Date  . Diabetes mellitus without complication (HCC)   . DJD (degenerative joint disease)   . AC (acromioclavicular) joint bone spurs   . ADHD (attention deficit hyperactivity disorder)   . Anxiety   . Depression   . Chronic back pain    Past Surgical History  Procedure Laterality Date  . Cholecystectomy    . Tubual ligation    . Tubal ligation     Family History  Problem Relation Age of Onset  . Bipolar disorder Mother   . Drug abuse Brother   . ADD / ADHD Son    Social History  Substance Use Topics  . Smoking status: Former Smoker -- 0.50 packs/day    Types: Cigarettes  . Smokeless  tobacco: None  . Alcohol Use: No   Lives with father and her 2 children  OB History    No data available     Review of Systems  Constitutional: Positive for fever. Negative for chills.  Gastrointestinal: Positive for nausea and vomiting.  Genitourinary: Positive for dysuria and difficulty urinating.  Musculoskeletal: Positive for back pain.  Neurological: Positive for light-headedness and numbness.  All other systems reviewed and are negative.   Allergies  Review of patient's allergies indicates no known allergies.  Home Medications   Prior to Admission medications   Medication Sig Start Date End Date Taking? Authorizing Provider  amphetamine-dextroamphetamine (ADDERALL) 10 MG tablet Take 10 mg by mouth 2 (two) times daily with a meal.   Yes Historical Provider, MD  clobetasol (OLUX) 0.05 % topical foam Apply 1 application topically daily.   Yes Historical Provider, MD  clobetasol cream (TEMOVATE) 0.05 % Apply 1 application topically 2 (two) times daily as needed.   Yes Historical Provider, MD  diazepam (VALIUM) 10 MG tablet Take 1 tablet (10 mg total) by mouth 2 (two) times daily. Patient taking differently: Take 10 mg by mouth 2 (two) times daily as needed for anxiety.  04/01/15  Yes Myrlene Broker, MD  diphenhydrAMINE (BENADRYL) 25 MG tablet Take 50 mg by mouth at bedtime as needed for sleep.    Yes Historical Provider, MD  DULoxetine (CYMBALTA)  30 MG capsule Take 30 mg by mouth daily.   Yes Historical Provider, MD  ibuprofen (ADVIL,MOTRIN) 200 MG tablet Take 400 mg by mouth every 6 (six) hours as needed.    Yes Historical Provider, MD  Insulin Glargine (LANTUS SOLOSTAR) 100 UNIT/ML Solostar Pen Inject 20 Units into the skin daily.    Yes Historical Provider, MD  losartan (COZAAR) 25 MG tablet Take 25 mg by mouth daily.   Yes Historical Provider, MD  metFORMIN (GLUCOPHAGE) 500 MG tablet Take 500 mg by mouth 2 (two) times daily with a meal.   Yes Historical Provider, MD   simvastatin (ZOCOR) 20 MG tablet Take 20 mg by mouth daily.   Yes Historical Provider, MD   BP 138/97 mmHg  Pulse 124  Temp(Src) 98.2 F (36.8 C) (Oral)  Resp 20  Ht 5\' 4"  (1.626 m)  Wt 192 lb (87.091 kg)  BMI 32.94 kg/m2  SpO2 98%  Vital signs normal     Physical Exam  Constitutional: She is oriented to person, place, and time. She appears well-developed and well-nourished.  Non-toxic appearance. She does not appear ill. No distress.  HENT:  Head: Normocephalic and atraumatic.  Right Ear: External ear normal.  Left Ear: External ear normal.  Nose: Nose normal. No mucosal edema or rhinorrhea.  Mouth/Throat: Mucous membranes are dry. No dental abscesses or uvula swelling.  Dry mucus membranes  Eyes: Conjunctivae and EOM are normal. Pupils are equal, round, and reactive to light.  Neck: Normal range of motion and full passive range of motion without pain. Neck supple.  Cardiovascular: Normal rate, regular rhythm and normal heart sounds.  Exam reveals no gallop and no friction rub.   No murmur heard. Pulmonary/Chest: Effort normal and breath sounds normal. No respiratory distress. She has no wheezes. She has no rhonchi. She has no rales. She exhibits no tenderness and no crepitus.  Abdominal: Soft. Normal appearance and bowel sounds are normal. She exhibits no distension. There is no rebound and no guarding.  Tenderness over bladder  Musculoskeletal: Normal range of motion. She exhibits no edema or tenderness.  Moves all extremities well. Bilateral CVAT  Neurological: She is alert and oriented to person, place, and time. She has normal strength. No cranial nerve deficit.  Skin: Skin is warm, dry and intact. No rash noted. No erythema. No pallor.  Face is flushed  Psychiatric: She has a normal mood and affect. Her speech is normal and behavior is normal. Her mood appears not anxious.  Nursing note and vitals reviewed.   ED Course  Procedures   Medications  sodium chloride  0.9 % bolus 1,000 mL (not administered)  insulin regular (NOVOLIN R,HUMULIN R) 250 Units in sodium chloride 0.9 % 250 mL (1 Units/mL) infusion (not administered)  cefTRIAXone (ROCEPHIN) 1 g in dextrose 5 % 50 mL IVPB (not administered)  ondansetron (ZOFRAN) injection 4 mg (not administered)  sodium chloride 0.9 % bolus 1,000 mL (1,000 mLs Intravenous New Bag/Given 08/01/15 0010)  ondansetron (ZOFRAN) injection 4 mg (4 mg Intravenous Given 08/01/15 0014)  cefTRIAXone (ROCEPHIN) 1 g in dextrose 5 % 50 mL IVPB (1 g Intravenous New Bag/Given 08/01/15 0017)     DIAGNOSTIC STUDIES: Oxygen Saturation is 98% on room air, normal by my interpretation.    COORDINATION OF CARE: 11:32 PM Reviewed lab results with pt.Discussed admission plan for DKA with pt at bedside and pt agreed to plan. She was started on IV fluids and an insulin drip for her diabetic ketoacidosis. She  was given Rocephin and a urine culture was ordered. Unfortunately her urine appeared to be very contaminated but her symptoms are consistent with a urinary tract infection.  12:01 AM Discussed this case with Dr. Mort Sawyers, Hospitalist who accepts pt for admission to stepdown.   Labs Review Results for orders placed or performed during the hospital encounter of 07/31/15  Urinalysis, Routine w reflex microscopic (not at [facility])  Result Value Ref Range   Color, Urine YELLOW YELLOW   APPearance HAZY (A) CLEAR   Specific Gravity, Urine 1.010 1.005 - 1.030   pH 5.5 5.0 - 8.0   Glucose, UA >1000 (A) NEGATIVE mg/dL   Hgb urine dipstick SMALL (A) NEGATIVE   Bilirubin Urine NEGATIVE NEGATIVE   Ketones, ur 15 (A) NEGATIVE mg/dL   Protein, ur TRACE (A) NEGATIVE mg/dL   Nitrite NEGATIVE NEGATIVE   Leukocytes, UA TRACE (A) NEGATIVE  CBC with Differential  Result Value Ref Range   WBC 14.1 (H) 4.0 - 10.5 K/uL   RBC 5.07 3.87 - 5.11 MIL/uL   Hemoglobin 16.4 (H) 12.0 - 15.0 g/dL   HCT 16.1 (H) 09.6 - 04.5 %   MCV 92.3 78.0 - 100.0 fL   MCH  32.3 26.0 - 34.0 pg   MCHC 35.0 30.0 - 36.0 g/dL   RDW 40.9 81.1 - 91.4 %   Platelets 304 150 - 400 K/uL   Neutrophils Relative % 60 %   Neutro Abs 8.4 (H) 1.7 - 7.7 K/uL   Lymphocytes Relative 32 %   Lymphs Abs 4.6 (H) 0.7 - 4.0 K/uL   Monocytes Relative 6 %   Monocytes Absolute 0.8 0.1 - 1.0 K/uL   Eosinophils Relative 2 %   Eosinophils Absolute 0.2 0.0 - 0.7 K/uL   Basophils Relative 0 %   Basophils Absolute 0.1 0.0 - 0.1 K/uL  Basic metabolic panel  Result Value Ref Range   Sodium 131 (L) 135 - 145 mmol/L   Potassium 3.9 3.5 - 5.1 mmol/L   Chloride 97 (L) 101 - 111 mmol/L   CO2 16 (L) 22 - 32 mmol/L   Glucose, Bld 498 (H) 65 - 99 mg/dL   BUN 15 6 - 20 mg/dL   Creatinine, Ser 7.82 0.44 - 1.00 mg/dL   Calcium 9.2 8.9 - 95.6 mg/dL   GFR calc non Af Amer >60 >60 mL/min   GFR calc Af Amer >60 >60 mL/min   Anion gap 18 (H) 5 - 15  Urine microscopic-add on  Result Value Ref Range   Squamous Epithelial / LPF TOO NUMEROUS TO COUNT (A) NONE SEEN   WBC, UA 6-30 0 - 5 WBC/hpf   RBC / HPF 6-30 0 - 5 RBC/hpf   Bacteria, UA MANY (A) NONE SEEN   Urine-Other YEAST PRESENT   POC CBG, ED  Result Value Ref Range   Glucose-Capillary 436 (H) 65 - 99 mg/dL  CBG monitoring, ED  Result Value Ref Range   Glucose-Capillary 578 (HH) 65 - 99 mg/dL   Comment 1 Notify RN    Comment 2 Document in Chart    Laboratory interpretation all normal except contaminated urine, hyperglycemia with metabolic acidosis (AG of 18), leukocytosis     MDM   Final diagnoses:  Diabetic ketoacidosis without coma associated with type 2 diabetes mellitus (HCC)  UTI (lower urinary tract infection)  Nausea and vomiting, vomiting of unspecified type   Plan admission   Devoria Albe, MD, FACEP  CRITICAL CARE Performed by: Devoria Albe L Total critical  care time: 32 minutes Critical care time was exclusive of separately billable procedures and treating other patients. Critical care was necessary to treat or prevent  imminent or life-threatening deterioration. Critical care was time spent personally by me on the following activities: development of treatment plan with patient and/or surrogate as well as nursing, discussions with consultants, evaluation of patient's response to treatment, examination of patient, obtaining history from patient or surrogate, ordering and performing treatments and interventions, ordering and review of laboratory studies, ordering and review of radiographic studies, pulse oximetry and re-evaluation of patient's condition.    I personally performed the services described in this documentation, which was scribed in my presence. The recorded information has been reviewed and considered.  Devoria Albe, MD, Concha Pyo, MD 08/01/15 [phone]

## 2015-08-01 ENCOUNTER — Encounter (HOSPITAL_COMMUNITY): Payer: Self-pay | Admitting: General Practice

## 2015-08-01 DIAGNOSIS — M549 Dorsalgia, unspecified: Secondary | ICD-10-CM | POA: Diagnosis not present

## 2015-08-01 DIAGNOSIS — R112 Nausea with vomiting, unspecified: Secondary | ICD-10-CM | POA: Diagnosis not present

## 2015-08-01 DIAGNOSIS — F419 Anxiety disorder, unspecified: Secondary | ICD-10-CM | POA: Diagnosis present

## 2015-08-01 DIAGNOSIS — R1110 Vomiting, unspecified: Secondary | ICD-10-CM | POA: Insufficient documentation

## 2015-08-01 DIAGNOSIS — Z87891 Personal history of nicotine dependence: Secondary | ICD-10-CM | POA: Diagnosis not present

## 2015-08-01 DIAGNOSIS — E1310 Other specified diabetes mellitus with ketoacidosis without coma: Secondary | ICD-10-CM | POA: Diagnosis not present

## 2015-08-01 DIAGNOSIS — F909 Attention-deficit hyperactivity disorder, unspecified type: Secondary | ICD-10-CM | POA: Diagnosis present

## 2015-08-01 DIAGNOSIS — Z9119 Patient's noncompliance with other medical treatment and regimen: Secondary | ICD-10-CM | POA: Diagnosis not present

## 2015-08-01 DIAGNOSIS — E1110 Type 2 diabetes mellitus with ketoacidosis without coma: Secondary | ICD-10-CM | POA: Diagnosis present

## 2015-08-01 DIAGNOSIS — G8929 Other chronic pain: Secondary | ICD-10-CM | POA: Diagnosis not present

## 2015-08-01 DIAGNOSIS — R739 Hyperglycemia, unspecified: Secondary | ICD-10-CM | POA: Diagnosis not present

## 2015-08-01 DIAGNOSIS — N10 Acute tubulo-interstitial nephritis: Secondary | ICD-10-CM | POA: Diagnosis present

## 2015-08-01 DIAGNOSIS — N390 Urinary tract infection, site not specified: Secondary | ICD-10-CM | POA: Diagnosis not present

## 2015-08-01 DIAGNOSIS — Z23 Encounter for immunization: Secondary | ICD-10-CM | POA: Diagnosis not present

## 2015-08-01 DIAGNOSIS — E860 Dehydration: Secondary | ICD-10-CM | POA: Diagnosis present

## 2015-08-01 DIAGNOSIS — Z794 Long term (current) use of insulin: Secondary | ICD-10-CM | POA: Diagnosis not present

## 2015-08-01 DIAGNOSIS — Z818 Family history of other mental and behavioral disorders: Secondary | ICD-10-CM | POA: Diagnosis not present

## 2015-08-01 LAB — CBG MONITORING, ED: GLUCOSE-CAPILLARY: 578 mg/dL — AB (ref 65–99)

## 2015-08-01 LAB — BASIC METABOLIC PANEL
ANION GAP: 10 (ref 5–15)
Anion gap: 10 (ref 5–15)
Anion gap: 11 (ref 5–15)
BUN: 15 mg/dL (ref 6–20)
BUN: 16 mg/dL (ref 6–20)
BUN: 15 mg/dL (ref 6–20)
CALCIUM: 9.3 mg/dL (ref 8.9–10.3)
CALCIUM: 9.6 mg/dL (ref 8.9–10.3)
CALCIUM: 8.6 mg/dL — AB (ref 8.9–10.3)
CO2: 25 mmol/L (ref 22–32)
CO2: 26 mmol/L (ref 22–32)
CO2: 25 mmol/L (ref 22–32)
CREATININE: 0.87 mg/dL (ref 0.44–1.00)
CREATININE: 0.65 mg/dL (ref 0.44–1.00)
Chloride: 95 mmol/L — ABNORMAL LOW (ref 101–111)
Chloride: 98 mmol/L — ABNORMAL LOW (ref 101–111)
Chloride: 102 mmol/L (ref 101–111)
Creatinine, Ser: 0.57 mg/dL (ref 0.44–1.00)
GFR calc Af Amer: >60 mL/min (ref >60)
GFR calc Af Amer: >60 mL/min (ref >60)
GFR calc non Af Amer: >60 mL/min (ref >60)
GLUCOSE: 674 mg/dL — AB (ref 65–99)
GLUCOSE: 458 mg/dL — AB (ref 65–99)
GLUCOSE: 308 mg/dL — AB (ref 65–99)
Potassium: 4.6 mmol/L (ref 3.5–5.1)
Potassium: 3.6 mmol/L (ref 3.5–5.1)
Potassium: 3.7 mmol/L (ref 3.5–5.1)
SODIUM: 137 mmol/L (ref 135–145)
Sodium: 130 mmol/L — ABNORMAL LOW (ref 135–145)
Sodium: 135 mmol/L (ref 135–145)

## 2015-08-01 LAB — GLUCOSE, CAPILLARY
GLUCOSE-CAPILLARY: 572 mg/dL — AB (ref 65–99)
GLUCOSE-CAPILLARY: 470 mg/dL — AB (ref 65–99)
GLUCOSE-CAPILLARY: 290 mg/dL — AB (ref 65–99)
GLUCOSE-CAPILLARY: 151 mg/dL — AB (ref 65–99)
GLUCOSE-CAPILLARY: 141 mg/dL — AB (ref 65–99)
GLUCOSE-CAPILLARY: 134 mg/dL — AB (ref 65–99)
GLUCOSE-CAPILLARY: 139 mg/dL — AB (ref 65–99)
Glucose-Capillary: >600 mg/dL (ref 65–99)
Glucose-Capillary: 477 mg/dL — ABNORMAL HIGH (ref 65–99)
Glucose-Capillary: 298 mg/dL — ABNORMAL HIGH (ref 65–99)
Glucose-Capillary: 271 mg/dL — ABNORMAL HIGH (ref 65–99)
Glucose-Capillary: 279 mg/dL — ABNORMAL HIGH (ref 65–99)
Glucose-Capillary: 193 mg/dL — ABNORMAL HIGH (ref 65–99)
Glucose-Capillary: 168 mg/dL — ABNORMAL HIGH (ref 65–99)
Glucose-Capillary: 155 mg/dL — ABNORMAL HIGH (ref 65–99)
Glucose-Capillary: 165 mg/dL — ABNORMAL HIGH (ref 65–99)
Glucose-Capillary: 302 mg/dL — ABNORMAL HIGH (ref 65–99)

## 2015-08-01 LAB — MRSA PCR SCREENING: MRSA by PCR: NEGATIVE

## 2015-08-01 MED ORDER — DEXTROSE-NACL 5-0.45 % IV SOLN: INTRAVENOUS | Status: DC

## 2015-08-01 MED ORDER — INSULIN REGULAR HUMAN 100 UNIT/ML IJ SOLN
INTRAMUSCULAR | Status: DC
  Filled 2015-08-01: qty 2.5

## 2015-08-01 MED ORDER — DULOXETINE HCL 30 MG PO CPEP
30.0000 mg | ORAL_CAPSULE | Freq: Every day | ORAL | Status: DC
  Administered 2015-08-01 – 2015-08-02 (×2): 30 mg via ORAL
  Filled 2015-08-01 (×3): qty 1

## 2015-08-01 MED ORDER — SODIUM CHLORIDE 0.9 % IV SOLN
INTRAVENOUS | Status: DC
  Administered 2015-08-01: 17:00:00 via INTRAVENOUS

## 2015-08-01 MED ORDER — POTASSIUM CHLORIDE 10 MEQ/100ML IV SOLN
10.0000 meq | INTRAVENOUS | Status: AC
  Administered 2015-08-01 (×2): 10 meq via INTRAVENOUS
  Filled 2015-08-01 (×2): qty 100

## 2015-08-01 MED ORDER — ENOXAPARIN SODIUM 40 MG/0.4ML ~~LOC~~ SOLN
40.0000 mg | SUBCUTANEOUS | Status: DC
  Administered 2015-08-01 – 2015-08-02 (×2): 40 mg via SUBCUTANEOUS
  Filled 2015-08-01 (×2): qty 0.4

## 2015-08-01 MED ORDER — AMPHETAMINE-DEXTROAMPHETAMINE 10 MG PO TABS
10.0000 mg | ORAL_TABLET | Freq: Two times a day (BID) | ORAL | Status: DC
  Administered 2015-08-01 – 2015-08-02 (×2): 10 mg via ORAL
  Filled 2015-08-01 (×2): qty 1

## 2015-08-01 MED ORDER — INSULIN GLARGINE 100 UNIT/ML ~~LOC~~ SOLN
20.0000 [IU] | Freq: Every day | SUBCUTANEOUS | Status: DC
  Administered 2015-08-01 – 2015-08-02 (×2): 20 [IU] via SUBCUTANEOUS
  Filled 2015-08-01 (×6): qty 0.2

## 2015-08-01 MED ORDER — INSULIN ASPART 100 UNIT/ML ~~LOC~~ SOLN
10.0000 [IU] | Freq: Once | SUBCUTANEOUS | Status: AC
  Administered 2015-08-01: 10 [IU] via SUBCUTANEOUS

## 2015-08-01 MED ORDER — LOSARTAN POTASSIUM 50 MG PO TABS
25.0000 mg | ORAL_TABLET | Freq: Every day | ORAL | Status: DC
Start: 2015-08-01 — End: 2015-08-02
  Administered 2015-08-01 – 2015-08-02 (×2): 25 mg via ORAL
  Filled 2015-08-01 (×2): qty 1

## 2015-08-01 MED ORDER — ONDANSETRON HCL 4 MG/2ML IJ SOLN: 4.0000 mg | Freq: Three times a day (TID) | INTRAMUSCULAR | Status: AC | PRN

## 2015-08-01 MED ORDER — SODIUM CHLORIDE 0.9 % IV SOLN
INTRAVENOUS | Status: DC
  Administered 2015-08-01: 01:00:00 via INTRAVENOUS

## 2015-08-01 MED ORDER — SIMVASTATIN 20 MG PO TABS
20.0000 mg | ORAL_TABLET | Freq: Every day | ORAL | Status: DC
Start: 2015-08-01 — End: 2015-08-02
  Administered 2015-08-01 – 2015-08-02 (×2): 20 mg via ORAL
  Filled 2015-08-01 (×2): qty 1

## 2015-08-01 MED ORDER — DEXTROSE 5 % IV SOLN
1.0000 g | INTRAVENOUS | Status: DC
  Administered 2015-08-02: 1 g via INTRAVENOUS
  Filled 2015-08-01 (×4): qty 10

## 2015-08-01 MED ORDER — DIAZEPAM 5 MG PO TABS: 10.0000 mg | ORAL_TABLET | Freq: Two times a day (BID) | ORAL | Status: DC | PRN

## 2015-08-01 MED ORDER — INSULIN ASPART 100 UNIT/ML ~~LOC~~ SOLN: 0.0000 [IU] | Freq: Three times a day (TID) | SUBCUTANEOUS | Status: DC

## 2015-08-01 MED ORDER — SODIUM CHLORIDE 0.9 % IV SOLN: INTRAVENOUS | Status: AC

## 2015-08-01 MED ORDER — ONDANSETRON HCL 4 MG/2ML IJ SOLN
4.0000 mg | Freq: Four times a day (QID) | INTRAMUSCULAR | Status: DC | PRN
  Administered 2015-08-01 – 2015-08-02 (×2): 4 mg via INTRAVENOUS
  Filled 2015-08-01 (×2): qty 2

## 2015-08-01 MED ORDER — HYDROMORPHONE HCL 1 MG/ML IJ SOLN
0.5000 mg | INTRAMUSCULAR | Status: DC | PRN
  Administered 2015-08-01 – 2015-08-02 (×8): 0.5 mg via INTRAVENOUS
  Filled 2015-08-01 (×8): qty 1

## 2015-08-01 MED ORDER — FLUCONAZOLE 100 MG PO TABS
150.0000 mg | ORAL_TABLET | Freq: Every day | ORAL | Status: AC
  Administered 2015-08-01 – 2015-08-02 (×2): 150 mg via ORAL
  Filled 2015-08-01 (×2): qty 2

## 2015-08-01 MED ORDER — SODIUM CHLORIDE 0.9 % IV SOLN
INTRAVENOUS | Status: AC
  Filled 2015-08-01: qty 2.5

## 2015-08-01 MED ORDER — PNEUMOCOCCAL VAC POLYVALENT 25 MCG/0.5ML IJ INJ
0.5000 mL | INJECTION | INTRAMUSCULAR | Status: AC
  Administered 2015-08-02: 0.5 mL via INTRAMUSCULAR

## 2015-08-01 NOTE — Progress Notes (Signed)
Despite nun's cap in the pt's toilet, pt is unable to void into the collection device. I&Os therefore not accurate.

## 2015-08-01 NOTE — ED Notes (Signed)
Urine in bladder - 334cc

## 2015-08-01 NOTE — Care Management Note (Signed)
Case Management Note  Patient Details  Name: Rickey Barbaraatricia A Darrow MRN: [id number] Date of Birth: Apr 25, 1976  Subjective/Objective:                  Pt admitted with DKA. Pt is from home, lives with her children and her father. Pt is ind with ADL's. Pt has mediad, pt has PCP, pt has no problems obtaining her medications and pt has transportation. Pt has no HH services or DME prior to admission. Pt plans to return home with self care at DC.   Action/Plan: No CM needs anticipated.   Expected Discharge Date:    08/03/2015              Expected Discharge Plan:  Home/Self Care  In-House Referral:  NA  Discharge planning Services  CM Consult  Post Acute Care Choice:  NA Choice offered to:  NA  DME Arranged:    DME Agency:     HH Arranged:    HH Agency:     Status of Service:  Completed, signed off  Medicare Important Message Given:    Date Medicare IM Given:    Medicare IM give by:    Date Additional Medicare IM Given:    Additional Medicare Important Message give by:     If discussed at [place] Length of Stay Meetings, dates discussed:    Additional Comments:  Malcolm MetroChildress, Romina Divirgilio Demske, RN 08/01/2015, 1:14 PM

## 2015-08-01 NOTE — H&P (Addendum)
[facility] Admission History and Physical       Ruth [name]:[id number] DOB: Feb 20, 1976 DOA: 07/31/2015  Referring physician: EDP PCP: Ruth [name]  Specialists:   Chief Complaint: Critically High Glucose levels  HPI: Ruth [name] is a 40 y.o. female with a history of DM2 who presents to the ED with complaints of critically high Blood sugars twice today.   She reprots having fever and chills and flank pain with nausea and vomiting and dysuria and increased thirst and urination since last night. Today she checked her glucose level and it was un-measurable, so she contacted her PCP who advised her to take a increased dose of her medication, and was advised if her glucose did not come down to go to the ED.   She reports that her glucose initially came down and was in the 300's but went up again later and was un-measurable again so she came to the ED.  In the ED her glucose was found at 498, and her Anion Gap was 18 so she was started on the DKA protocol with IV Insulin and IVFs.   She was also found to have a UTI and a Urien Culture was sent and she was administered IV Rocephin and referred for admission.      Review of Systems:   Constitutional: No Weight Loss, No Weight Gain, Night Sweats, +Fevers, Chills,  +Polydipsia,  Dizziness, Light Headedness, Fatigue, or Generalized Weakness HEENT: No Headaches, Difficulty Swallowing,Tooth/Dental Problems,Sore Throat,  No Sneezing, Rhinitis, Ear Ache, Nasal Congestion, or Post Nasal Drip,  Cardio-vascular:  No Chest pain, Orthopnea, PND, Edema in Lower Extremities, Anasarca, Dizziness, Palpitations  Resp: No Dyspnea, No DOE, No Productive Cough, No Non-Productive Cough, No Hemoptysis, No Wheezing.    GI: No Heartburn, Indigestion, Abdominal Pain, Nausea, Vomiting, Diarrhea, Constipation, Hematemesis, Hematochezia, Melena, Change in Bowel Habits,  Loss of Appetite  GU:  +Dysuria, No Change in Color of Urine, +Polyuria, No Urgency  or Urinary Frequency, +Flank pain.  Musculoskeletal: No Joint Pain or Swelling, No Decreased Range of Motion, No Back Pain.  Neurologic: No Syncope, No Seizures, Muscle Weakness, Paresthesia, Vision Disturbance or Loss, No Diplopia, No Vertigo, No Difficulty Walking,  Skin: No Rash or Lesions. Psych: No Change in Mood or Affect, No Depression or Anxiety, No Memory loss, No Confusion, or Hallucinations   Past Medical History  Diagnosis Date  . Diabetes mellitus without complication (HCC)   . DJD (degenerative joint disease)   . AC (acromioclavicular) joint bone spurs   . ADHD (attention deficit hyperactivity disorder)   . Anxiety   . Depression   . Chronic back pain      Past Surgical History  Procedure Laterality Date  . Cholecystectomy    . Tubual ligation    . Tubal ligation        Prior to Admission medications   Medication Sig Start Date End Date Taking? Authorizing Provider  amphetamine-dextroamphetamine (ADDERALL) 10 MG tablet Take 10 mg by mouth 2 (two) times daily with a meal.   Yes Historical Provider, MD  clobetasol (OLUX) 0.05 % topical foam Apply 1 application topically daily.   Yes Historical Provider, MD  clobetasol cream (TEMOVATE) 0.05 % Apply 1 application topically 2 (two) times daily as needed.   Yes Historical Provider, MD  diazepam (VALIUM) 10 MG tablet Take 1 tablet (10 mg total) by mouth 2 (two) times daily. Patient taking differently: Take 10 mg by mouth 2 (two) times daily as needed for anxiety.  04/01/15  Yes Myrlene Broker, MD  diphenhydrAMINE (BENADRYL) 25 MG tablet Take 50 mg by mouth at bedtime as needed for sleep.    Yes Historical Provider, MD  DULoxetine (CYMBALTA) 30 MG capsule Take 30 mg by mouth daily.   Yes Historical Provider, MD  ibuprofen (ADVIL,MOTRIN) 200 MG tablet Take 400 mg by mouth every 6 (six) hours as needed.    Yes Historical Provider, MD  Insulin Glargine (LANTUS SOLOSTAR) 100 UNIT/ML Solostar Pen Inject 20 Units into the skin  daily.    Yes Historical Provider, MD  losartan (COZAAR) 25 MG tablet Take 25 mg by mouth daily.   Yes Historical Provider, MD  metFORMIN (GLUCOPHAGE) 500 MG tablet Take 500 mg by mouth 2 (two) times daily with a meal.   Yes Historical Provider, MD  simvastatin (ZOCOR) 20 MG tablet Take 20 mg by mouth daily.   Yes Historical Provider, MD     No Known Allergies  Social History:  reports that she has quit smoking. Her smoking use included Cigarettes. She smoked 0.50 packs per day. She does not have any smokeless tobacco history on file. She reports that she does not drink alcohol or use illicit drugs.    Family History  Problem Relation Age of Onset  . Bipolar disorder Mother   . Drug abuse Brother   . ADD / ADHD Son        Physical Exam:  GEN:    Pleasant Well Nourished and Well Developed  40 y.o. Caucasian female examined and in no acute distress; cooperative with exam Filed Vitals:   07/31/15 2048 07/31/15 2348  BP: 138/97 136/96  Pulse: 124 121  Temp: 98.2 F (36.8 C)   TempSrc: Oral   Resp: 20 18  Height:  (1.626 m)   Weight: 87.091 kg (192 lb)   SpO2: 98% 98%   Blood pressure 136/96, pulse 121, temperature 98.2 F (36.8 C), temperature source Oral, resp. rate 18, height  (1.626 m), weight 87.091 kg (192 lb), SpO2 98 %. PSYCH: She is alert and oriented x4; does not appear anxious does not appear depressed; affect is normal HEENT: Normocephalic and Atraumatic, Mucous membranes pink; PERRLA; EOM intact; Fundi:  Benign;  No scleral icterus, Nares: Patent, Oropharynx: Clear, Fair Dentition,    Neck:  FROM, No Cervical Lymphadenopathy nor Thyromegaly or Carotid Bruit; No JVD; Breasts:: Not examined CHEST WALL: No tenderness CHEST: Normal respiration, clear to auscultation bilaterally HEART:  TaAchycardic Regular Rhythm; no murmurs rubs or gallops BACK: No kyphosis or scoliosis; No CVA tenderness ABDOMEN: Positive Bowel Sounds, Soft Non-Tender, No Rebound or  Guarding; No Masses, No Organomegaly. Rectal Exam: Not done EXTREMITIES: No Cyanosis, Clubbing, or Edema; No Ulcerations. Genitalia: not examined PULSES: 2+ and symmetric SKIN: Normal hydration no rash or ulceration CNS:  Alert and Oriented x 4, No Focal Deficits Vascular: pulses palpable throughout    Labs on Admission:  Basic Metabolic Panel:  Recent Labs Lab 07/31/15 2104  NA 131*  K 3.9  CL 97*  CO2 16*  GLUCOSE 498*  BUN 15  CREATININE 0.75  CALCIUM 9.2   Liver Function Tests: No results for input(s): AST, ALT, ALKPHOS, BILITOT, PROT, ALBUMIN in the last 168 hours. No results for input(s): LIPASE, AMYLASE in the last 168 hours. No results for input(s): AMMONIA in the last 168 hours. CBC:  Recent Labs Lab 07/31/15 2104  WBC 14.1*  NEUTROABS 8.4*  HGB 16.4*  HCT 46.8*  MCV 92.3  PLT 304  Cardiac Enzymes: No results for input(s): CKTOTAL, CKMB, CKMBINDEX, TROPONINI in the last 168 hours.  BNP (last 3 results) No results for input(s): BNP in the last 8760 hours.  ProBNP (last 3 results) No results for input(s): PROBNP in the last 8760 hours.  CBG:  Recent Labs Lab 07/31/15 2052  GLUCAP 436*    Radiological Exams on Admission: No results found.        Assessment/Plan:     40 y.o. female with  Principal Problem:    DKA (diabetic ketoacidoses) (HCC)    DKA Protocol with IV Insulin    IVFs    Monitor Electrolytes   Active Problems:    UTI (lower urinary tract infection)    Urine C+S sent    IV Rocephin    Nausea and Vomiting    PRN IV Zofran      Dehydration    IVFs    DVT Prophylaxis    Lovenox     Code Status:     FULL CODE       Family Communication:   No Family Present    Disposition Plan:    Inpatient Status with Expected LOS 2-3 Days   Time spent: 70 Minutes      Tomara Youngberg C [facility] Pager [phone]   If 7AM -7PM Please Contact the Day Rounding Team MD for [facility]  If 7PM-7AM, Please  Contact Night-Floor Coverage  [email or web address] Password [email or web address] 08/01/2015, 12:25 AM     ADDENDUM:   Patient was seen and examined on 08/01/2015

## 2015-08-01 NOTE — Progress Notes (Signed)
Notified physician due to increasing blood glucose(CBG) without coverage at this time. Coverage doesn't start until next AM. One time coverage order received and given as ordered.

## 2015-08-01 NOTE — Progress Notes (Signed)
I have seen and assessed patient and agree with Dr. Lovell SheehanJenkins assessment and plan. Patient is a 40 year old female history of type 2 diabetes presenting with two-day history of fever chills bilateral flank pain right greater than left with nausea vomiting and dysuria with increased thirst and urination. Patient noted to have elevated CBGs and noted to be in DKA with anion gap of 18. Patient was admitted placed on the glucose stabilizer with IV fluids, pain management and supportive care. Patient improving clinically. Patient with bilateral CVA tenderness right greater than left and suprapubic tenderness to palpation on examination. Physical exam is otherwise unremarkable. Urine cultures pending. Check blood cultures 2. Continue IV Rocephin. Once CBGs are consistently less than 200 will transition to subcutaneous insulin Lantus 20 units daily and discontinue glucose stabilizer 2 hours after subcutaneous Lantus has been given. Will then advance diet to a carb modified diet. Sliding scale insulin.

## 2015-08-01 NOTE — Progress Notes (Signed)
Results for Ruth [name], Ruth [name] (MRN [id number]) as of 08/01/2015 15:15  Ref. Range 08/01/2015 01:30  Glucose Latest Ref Range: 65-99 mg/dL [id number] (HH)   Spoke with patient about diabetes and home regimen for diabetes control. Patient reports that she is followed by Arnette FeltsErin Jones, PA at [facility] for diabetes management. Patient reports that she had Gestational Diabetes during her pregnancy and then developed DM2 years later. Patient states she takes Lantus 25 units daily and Metformin 500 mg BID as an outpatient for diabetes control. Patient states that she was started on Lantus [name] few months ago and her last A1C was 6.5% after being on Lantus for [name] few months. Patient states that she was taking Lantus 20 units and when she called her doctor the day before she was admitted she was instructed to increase Lantus to 25 units daily and to increase it by 3 more units in [name] couple days and then stay at that dose for now. However, patient states that her glucose read "HI" on her glucometer even after taking the additional insulin so she had her father bring her to the hospital.  Patient states that she checks her glucose 2-3 times per day and that it is usually runs good. However, over the past couple of days her glucose was much higher and reading "HI" on her glucometer.  Discussed glucose and A1C goals. Patient states that her mother had diabetes and died from complication from diabetes and she knows very well what diabetes can do if it is not well controlled. Discussed importance of checking CBGs and maintaining good CBG control to prevent Butcher-term and short-term complications. Reviewed how hyperglycemia leads to damage within blood vessels which lead to the common complications seen with uncontrolled diabetes. Stressed to the patient the importance of improving glycemic control to prevent further complications from uncontrolled diabetes. Discussed impact of nutrition, exercise, stress, sickness, and medications on diabetes  control. Explained the progressive nature of DM2 and explained that she may need more basal insulin as well as Novolog insulin for glycemic control. Patient states that she is willing to take Novolog insulin as well as Lantus if needed.  Patient verbalized understanding of information discussed and she states that she has no further questions at this time related to diabetes. Patient is being transitioned off IV insulin to SQ insulin as she has an order for Lantus 20 units daily which is to be given at 4pm today and then MD plans to order Novolog correction scale. Recommend ordering CBGs and Novolog moderate correction scale Q4H once she is transitioned off IV insulin.   Thanks, Orlando PennerMarie Abdelaziz Westenberger, RN, MSN, CDE Diabetes Coordinator Inpatient Diabetes Program [phone] (Team Pager) [phone] ([facility] office) [phone] [facility] office) [phone] [facility] office)

## 2015-08-02 DIAGNOSIS — Z794 Long term (current) use of insulin: Secondary | ICD-10-CM

## 2015-08-02 DIAGNOSIS — E1310 Other specified diabetes mellitus with ketoacidosis without coma: Principal | ICD-10-CM

## 2015-08-02 DIAGNOSIS — M549 Dorsalgia, unspecified: Secondary | ICD-10-CM

## 2015-08-02 DIAGNOSIS — G8929 Other chronic pain: Secondary | ICD-10-CM

## 2015-08-02 LAB — BASIC METABOLIC PANEL
ANION GAP: 9 (ref 5–15)
BUN: 11 mg/dL (ref 6–20)
CO2: 25 mmol/L (ref 22–32)
Calcium: 8.5 mg/dL — ABNORMAL LOW (ref 8.9–10.3)
Chloride: 103 mmol/L (ref 101–111)
Creatinine, Ser: 0.44 mg/dL (ref 0.44–1.00)
GFR calc Af Amer: >60 mL/min (ref >60)
Glucose, Bld: 248 mg/dL — ABNORMAL HIGH (ref 65–99)
POTASSIUM: 4 mmol/L (ref 3.5–5.1)
SODIUM: 137 mmol/L (ref 135–145)

## 2015-08-02 LAB — HEMOGLOBIN A1C
HEMOGLOBIN A1C: 10 % — AB (ref 4.8–5.6)
Mean Plasma Glucose: 240 mg/dL

## 2015-08-02 LAB — CBC
HCT: 38.8 % (ref 36.0–46.0)
Hemoglobin: 13.3 g/dL (ref 12.0–15.0)
MCH: 32.8 pg (ref 26.0–34.0)
MCHC: 34.3 g/dL (ref 30.0–36.0)
MCV: 95.6 fL (ref 78.0–100.0)
PLATELETS: 161 10*3/uL (ref 150–400)
RBC: 4.06 MIL/uL (ref 3.87–5.11)
RDW: 13.9 % (ref 11.5–15.5)
WBC: 7.2 10*3/uL (ref 4.0–10.5)

## 2015-08-02 LAB — GLUCOSE, CAPILLARY
GLUCOSE-CAPILLARY: 246 mg/dL — AB (ref 65–99)
GLUCOSE-CAPILLARY: 292 mg/dL — AB (ref 65–99)
Glucose-Capillary: 311 mg/dL — ABNORMAL HIGH (ref 65–99)

## 2015-08-02 MED ORDER — INSULIN ASPART 100 UNIT/ML ~~LOC~~ SOLN
10.0000 [IU] | Freq: Once | SUBCUTANEOUS | Status: AC
  Administered 2015-08-02: 10 [IU] via SUBCUTANEOUS

## 2015-08-02 MED ORDER — INSULIN ASPART 100 UNIT/ML FLEXPEN: 4.0000 [IU] | PEN_INJECTOR | Freq: Three times a day (TID) | SUBCUTANEOUS | Status: AC

## 2015-08-02 MED ORDER — INSULIN ASPART 100 UNIT/ML ~~LOC~~ SOLN: 0.0000 [IU] | Freq: Every day | SUBCUTANEOUS | Status: DC

## 2015-08-02 MED ORDER — INSULIN ASPART 100 UNIT/ML ~~LOC~~ SOLN
0.0000 [IU] | Freq: Three times a day (TID) | SUBCUTANEOUS | Status: DC
  Administered 2015-08-02: 5 [IU] via SUBCUTANEOUS

## 2015-08-02 MED ORDER — INSULIN ASPART 100 UNIT/ML ~~LOC~~ SOLN
4.0000 [IU] | Freq: Three times a day (TID) | SUBCUTANEOUS | Status: DC
  Administered 2015-08-02: 4 [IU] via SUBCUTANEOUS

## 2015-08-02 MED ORDER — INSULIN ASPART 100 UNIT/ML ~~LOC~~ SOLN: 10.0000 [IU] | Freq: Once | SUBCUTANEOUS | Status: DC

## 2015-08-02 NOTE — Progress Notes (Signed)
Orders received for discharge. Instructions and prescriptions given to pt. Understanding verbalized. IV removed by pt; no complications. Pt refused WC and walked herself out in stable condition.

## 2015-08-02 NOTE — Care Management Note (Signed)
Case Management Note  Patient Details  Name: Rickey Barbaraatricia A Pinkus MRN: [id number] Date of Birth: 11-29-1975  Expected Discharge Date:                  Expected Discharge Plan:  Home/Self Care  In-House Referral:  NA  Discharge planning Services  CM Consult  Post Acute Care Choice:  NA Choice offered to:  NA  DME Arranged:    DME Agency:     HH Arranged:    HH Agency:     Status of Service:  Completed, signed off  Medicare Important Message Given:    Date Medicare IM Given:    Medicare IM give by:    Date Additional Medicare IM Given:    Additional Medicare Important Message give by:     If discussed at [place] Length of Stay Meetings, dates discussed:    Additional Comments: Pt discharging home today with self care. No CM needs.   Malcolm Metrohildress, Aryonna Gunnerson Demske, RN 08/02/2015, 10:45 AM

## 2015-08-02 NOTE — Discharge Summary (Signed)
Physician Discharge Summary  Rickey Barbaraatricia A Mcduffee AVW:[id number] DOB: 04-12-1976 DOA: 07/31/2015  PCP: Jobe MarkerJONES,ERIN, PA-C  Admit date: 07/31/2015 Discharge date: 08/02/2015  Time spent: 35 minutes  Recommendations for Outpatient Follow-up:  1. Follow-up with primary care doctor in 2-4 weeks. 2. She will bring CBG logs.     Discharge Diagnoses:  Principal Problem:   DKA (diabetic ketoacidoses) (HCC) Active Problems:   UTI (lower urinary tract infection)   Dehydration   DKA, type 2 (HCC)   Acute pyelonephritis   Chronic back pain   Discharge Condition: stbale  Diet recommendation: Carb modified  Filed Weights   07/31/15 2048 08/01/15 0200 08/02/15 0500  Weight: 87.091 kg (192 lb) 86.7 kg (191 lb 2.2 oz) 86.7 kg (191 lb 2.2 oz)    History of present illness:  40 year old past medical history of diabetes mellitus type 2 with unknown hemoglobin A1c comes in for high blood glucose this started on the day of admission. She related to polydipsia and polyuria.  Hospital Course:  DKA/diabetes mellitus type 2: Unclear cause likely due to noncompliance. She was started on IV insulin once her blood glucose was controlled and her gap is closed she was changed to Vickerman-acting insulin and added sliding scale. Blood cultures and urine cultures were checked which were negative 2.  Nausea and vomiting: Likely due to DKA resolved.   Procedures:  None  Consultations:  none  Discharge Exam: Filed Vitals:   08/02/15 0500 08/02/15 0600  BP: 120/87 113/79  Pulse:    Temp:    Resp: 15 12    General: A&O x3 Cardiovascular: RRR Respiratory: good air movement CTA B/L  Discharge Instructions   Discharge Instructions    Diet - low sodium heart healthy    Complete by:  As directed      Increase activity slowly    Complete by:  As directed           Current Discharge Medication List    START taking these medications   Details  insulin aspart (NOVOLOG) 100 UNIT/ML FlexPen  Inject 4 Units into the skin 3 (three) times daily with meals. Qty: 15 mL, Refills: 11      CONTINUE these medications which have NOT CHANGED   Details  amphetamine-dextroamphetamine (ADDERALL) 10 MG tablet Take 10 mg by mouth 2 (two) times daily with a meal.    clobetasol (OLUX) 0.05 % topical foam Apply 1 application topically daily.    clobetasol cream (TEMOVATE) 0.05 % Apply 1 application topically 2 (two) times daily as needed.    diazepam (VALIUM) 10 MG tablet Take 1 tablet (10 mg total) by mouth 2 (two) times daily. Qty: 60 tablet, Refills: 2    diphenhydrAMINE (BENADRYL) 25 MG tablet Take 50 mg by mouth at bedtime as needed for sleep.     DULoxetine (CYMBALTA) 30 MG capsule Take 30 mg by mouth daily.    ibuprofen (ADVIL,MOTRIN) 200 MG tablet Take 400 mg by mouth every 6 (six) hours as needed.     Insulin Glargine (LANTUS SOLOSTAR) 100 UNIT/ML Solostar Pen Inject 20 Units into the skin daily.     losartan (COZAAR) 25 MG tablet Take 25 mg by mouth daily.    metFORMIN (GLUCOPHAGE) 500 MG tablet Take 500 mg by mouth 2 (two) times daily with a meal.    simvastatin (ZOCOR) 20 MG tablet Take 20 mg by mouth daily.       No Known Allergies Follow-up Information    Follow up with JONES,ERIN, PA-C  In 2 weeks.   Specialty:  Family Medicine   Contact information:   [address] [place] [address] [phone]        The results of significant diagnostics from this hospitalization (including imaging, microbiology, ancillary and laboratory) are listed below for reference.    Significant Diagnostic Studies: No results found.  Microbiology: Recent Results (from the past 240 hour(s))  MRSA PCR Screening     Status: None   Collection Time: 08/01/15  1:25 AM  Result Value Ref Range Status   MRSA by PCR NEGATIVE NEGATIVE Final    Comment:        The GeneXpert MRSA Assay (FDA approved for NASAL specimens only), is one component of a comprehensive MRSA  colonization surveillance program. It is not intended to diagnose MRSA infection nor to guide or monitor treatment for MRSA infections.      Labs: Basic Metabolic Panel:  Recent Labs Lab 07/31/15 2104 08/01/15 0130 08/01/15 0415 08/01/15 0842 08/02/15 0408  NA 131* 130* 135 137 137  K 3.9 4.6 3.6 3.7 4.0  CL 97* 95* 98* 102 103  CO2 16* GLUCOSE 498* 674* 458* 308* 248*  BUN CREATININE 0.75 0.87 0.65 0.57 0.44  CALCIUM 9.2 9.3 9.6 8.6* 8.5*   Liver Function Tests: No results for input(s): AST, ALT, ALKPHOS, BILITOT, PROT, ALBUMIN in the last 168 hours. No results for input(s): LIPASE, AMYLASE in the last 168 hours. No results for input(s): AMMONIA in the last 168 hours. CBC:  Recent Labs Lab 07/31/15 2104 08/02/15 0408  WBC 14.1* 7.2  NEUTROABS 8.4*  --   HGB 16.4* 13.3  HCT 46.8* 38.8  MCV 92.3 95.6  PLT 304 161   Cardiac Enzymes: No results for input(s): CKTOTAL, CKMB, CKMBINDEX, TROPONINI in the last 168 hours. BNP: BNP (last 3 results) No results for input(s): BNP in the last 8760 hours.  ProBNP (last 3 results) No results for input(s): PROBNP in the last 8760 hours.  CBG:  Recent Labs Lab 08/01/15 1358 08/01/15 1452 08/01/15 1551 08/01/15 1652 08/01/15 2131  GLUCAP 151* 141* 134* 139* 302*       Signed:  Marinda Elk MD.  [facility] 08/02/2015, 7:51 AM

## 2015-08-03 LAB — URINE CULTURE: Culture: 20000

## 2015-08-06 LAB — CULTURE, BLOOD (ROUTINE X 2)
CULTURE: NO GROWTH
Culture: NO GROWTH

## 2016-03-04 ENCOUNTER — Encounter (HOSPITAL_COMMUNITY): Payer: Self-pay

## 2016-03-04 ENCOUNTER — Emergency Department (HOSPITAL_COMMUNITY)
Admission: EM | Admit: 2016-03-04 | Discharge: 2016-03-04 | Disposition: A | Discharge status: Home / Self Care | Payer: Medicaid Other | Attending: Emergency Medicine | Admitting: Emergency Medicine

## 2016-03-04 ENCOUNTER — Emergency Department (HOSPITAL_COMMUNITY): Payer: Medicaid Other

## 2016-03-04 DIAGNOSIS — F909 Attention-deficit hyperactivity disorder, unspecified type: Secondary | ICD-10-CM | POA: Insufficient documentation

## 2016-03-04 DIAGNOSIS — E119 Type 2 diabetes mellitus without complications: Secondary | ICD-10-CM | POA: Insufficient documentation

## 2016-03-04 DIAGNOSIS — Z79899 Other long term (current) drug therapy: Secondary | ICD-10-CM | POA: Insufficient documentation

## 2016-03-04 DIAGNOSIS — Z87891 Personal history of nicotine dependence: Secondary | ICD-10-CM | POA: Insufficient documentation

## 2016-03-04 DIAGNOSIS — R0789 Other chest pain: Secondary | ICD-10-CM | POA: Diagnosis present

## 2016-03-04 DIAGNOSIS — Z794 Long term (current) use of insulin: Secondary | ICD-10-CM | POA: Insufficient documentation

## 2016-03-04 DIAGNOSIS — R6889 Other general symptoms and signs: Secondary | ICD-10-CM

## 2016-03-04 DIAGNOSIS — R51 Headache: Secondary | ICD-10-CM | POA: Diagnosis not present

## 2016-03-04 LAB — URINALYSIS, ROUTINE W REFLEX MICROSCOPIC
BILIRUBIN URINE: NEGATIVE
Glucose, UA: 250 mg/dL — AB
Hgb urine dipstick: NEGATIVE
NITRITE: NEGATIVE
PH: 6 (ref 5.0–8.0)
Specific Gravity, Urine: 1.02 (ref 1.005–1.030)

## 2016-03-04 LAB — COMPREHENSIVE METABOLIC PANEL
ALBUMIN: 4.5 g/dL (ref 3.5–5.0)
ALT: 32 U/L (ref 14–54)
AST: 23 U/L (ref 15–41)
Alkaline Phosphatase: 55 U/L (ref 38–126)
Anion gap: 9 (ref 5–15)
BILIRUBIN TOTAL: 0.9 mg/dL (ref 0.3–1.2)
BUN: 12 mg/dL (ref 6–20)
CHLORIDE: 101 mmol/L (ref 101–111)
CO2: 22 mmol/L (ref 22–32)
CREATININE: 0.61 mg/dL (ref 0.44–1.00)
Calcium: 9.9 mg/dL (ref 8.9–10.3)
GFR calc Af Amer: >60 mL/min (ref >60)
GFR calc non Af Amer: >60 mL/min (ref >60)
GLUCOSE: 263 mg/dL — AB (ref 65–99)
POTASSIUM: 3.8 mmol/L (ref 3.5–5.1)
Sodium: 132 mmol/L — ABNORMAL LOW (ref 135–145)
Total Protein: 7.8 g/dL (ref 6.5–8.1)

## 2016-03-04 LAB — RAPID URINE DRUG SCREEN, HOSP PERFORMED
AMPHETAMINES: POSITIVE — AB
Barbiturates: NOT DETECTED
Benzodiazepines: NOT DETECTED
Cocaine: NOT DETECTED
Opiates: NOT DETECTED
TETRAHYDROCANNABINOL: NOT DETECTED

## 2016-03-04 LAB — CBC
HEMATOCRIT: 46.6 % — AB (ref 36.0–46.0)
HEMOGLOBIN: 16.6 g/dL — AB (ref 12.0–15.0)
MCH: 32.7 pg (ref 26.0–34.0)
MCHC: 35.6 g/dL (ref 30.0–36.0)
MCV: 91.9 fL (ref 78.0–100.0)
Platelets: 265 10*3/uL (ref 150–400)
RBC: 5.07 MIL/uL (ref 3.87–5.11)
RDW: 12.4 % (ref 11.5–15.5)
WBC: 13.4 10*3/uL — AB (ref 4.0–10.5)

## 2016-03-04 LAB — PREGNANCY, URINE: PREG TEST UR: NEGATIVE

## 2016-03-04 LAB — URINE MICROSCOPIC-ADD ON

## 2016-03-04 LAB — CBG MONITORING, ED: Glucose-Capillary: 299 mg/dL — ABNORMAL HIGH (ref 65–99)

## 2016-03-04 LAB — PROTIME-INR
INR: 1.19
Prothrombin Time: 15.2 seconds (ref 11.4–15.2)

## 2016-03-04 LAB — ETHANOL: Alcohol, Ethyl (B): 10 mg/dL — ABNORMAL HIGH (ref <5)

## 2016-03-04 LAB — DIFFERENTIAL
BASOS ABS: 0 10*3/uL (ref 0.0–0.1)
BASOS PCT: 0 %
EOS ABS: 0.3 10*3/uL (ref 0.0–0.7)
Eosinophils Relative: 2 %
LYMPHS ABS: 3 10*3/uL (ref 0.7–4.0)
Lymphocytes Relative: 23 %
Monocytes Absolute: 0.8 10*3/uL (ref 0.1–1.0)
Monocytes Relative: 6 %
NEUTROS ABS: 9.3 10*3/uL — AB (ref 1.7–7.7)
Neutrophils Relative %: 69 %

## 2016-03-04 LAB — APTT: APTT: 31 s (ref 24–36)

## 2016-03-04 LAB — TROPONIN I: Troponin I: <0.03 ng/mL (ref <0.03)

## 2016-03-04 NOTE — ED Triage Notes (Signed)
Pt complain of headache and numbness that comes and goes in the left arm.

## 2016-03-04 NOTE — ED Provider Notes (Signed)
[facility]-EM[facility]ENCY DEPT Provider Note   CSN: [id number] Arrival date & time: 03/04/16  1221     History   Chief Complaint Chief Complaint  Patient presents with  . Weakness    HPI Ruth [name] is a 40 y.o. female.  HPI  40 year old female with a history of diabetes, hypertension, and hyperlipidemia presents with left-sided heaviness and chest heaviness. States it started around 11 PM last night. Has been constant since onset. She fell asleep around 4 AM but as she woke up she still had her symptoms. Just prior to arrival she states all the symptoms seem to have resolved. She states the heaviness included her left face, left arm, and left leg. She has a hard time describing exactly with the heaviness means but she states she was able to move her arm and was not dropping things. There was never facial droop or slurred speech. She did feel some posterior tongue numbness. While she was having only symptom she was also short of breath. She has not taken anything for the symptoms, they seem to have spontaneously resolved. Has never had similar symptoms. Denies any associated headache, neck pain, or blurry vision. No recent illness such as vomiting or diarrhea.  Past Medical History:  Diagnosis Date  . AC (acromioclavicular) joint bone spurs   . ADHD (attention deficit hyperactivity disorder)   . Anxiety   . Chronic back pain   . Depression   . Diabetes mellitus without complication (HCC)   . DJD (degenerative joint disease)     Patient Active Problem List   Diagnosis Date Noted  . Chronic back pain 08/02/2015  . DKA (diabetic ketoacidoses) (HCC) 08/01/2015  . UTI (lower urinary tract infection) 08/01/2015  . Dehydration 08/01/2015  . DKA, type 2 (HCC) 08/01/2015  . Acute pyelonephritis 08/01/2015  . Emesis   . Hypothermia 06/02/2015  . Hypotension 06/02/2015  . Diabetes (HCC) 06/02/2015  . 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  Generalized anxiety disorder 03/06/2014    Past Surgical History:  Procedure Laterality Date  . CHOLECYSTECTOMY    . TUBAL LIGATION    . tubual ligation      OB History    No data available       Home Medications    Prior to Admission medications   Medication Sig Start Date End Date Taking? Authorizing Provider  clobetasol (OLUX) 0.05 % topical foam Apply 1 application topically daily.   Yes Historical Provider, MD  clobetasol cream (TEMOVATE) 0.05 % Apply 1 application topically 2 (two) times daily as needed.   Yes Historical Provider, MD  diphenhydrAMINE (BENADRYL) 25 MG tablet Take 50 mg by mouth at bedtime as needed for sleep.    Yes Historical Provider, MD  DULoxetine (CYMBALTA) 30 MG capsule Take 30 mg by mouth daily.   Yes Historical Provider, MD  ibuprofen (ADVIL,MOTRIN) 200 MG tablet Take 400 mg by mouth every 6 (six) hours as needed.    Yes Historical Provider, MD  insulin aspart (NOVOLOG) 100 UNIT/ML FlexPen Inject 4 Units into the skin 3 (three) times daily with meals. Patient taking differently: Inject 20-30 Units into the skin 2 (two) times daily. 30 units in the morning and 20 units in the afternoon. 08/02/15  Yes Marinda ElkAbraham Feliz Ortiz, MD  Insulin Glargine (LANTUS SOLOSTAR) 100 UNIT/ML Solostar Pen Inject 20 Units into the skin daily.    Yes Historical Provider, MD  losartan (COZAAR) 25 MG tablet Take 25 mg by mouth daily.  Yes Historical Provider, MD  metFORMIN (GLUCOPHAGE) 500 MG tablet Take 500 mg by mouth 2 (two) times daily with a meal.   Yes Historical Provider, MD  simvastatin (ZOCOR) 20 MG tablet Take 20 mg by mouth daily.   Yes Historical Provider, MD  traMADol (ULTRAM) 50 MG tablet Take 50 mg by mouth 2 (two) times daily.   Yes Historical Provider, MD  amphetamine-dextroamphetamine (ADDERALL) 10 MG tablet Take 10 mg by mouth 2 (two) times daily with a meal.    Historical Provider, MD    Family History Family History  Problem Relation Age of Onset  .  Bipolar disorder Mother   . Drug abuse Brother   . ADD / ADHD Son     Social History Social History  Substance Use Topics  . Smoking status: Former Smoker    Packs/day: 0.50    Types: Cigarettes  . Smokeless tobacco: Never Used  . Alcohol use Yes     Allergies   Review of patient's allergies indicates no known allergies.   Review of Systems Review of Systems  Eyes: Negative for visual disturbance.  Respiratory: Positive for shortness of breath.   Cardiovascular: Positive for chest pain.  Gastrointestinal: Negative for abdominal pain, diarrhea and vomiting.  Musculoskeletal: Negative for back pain and neck pain.  Neurological: Positive for weakness. Negative for dizziness, numbness and headaches.  All other systems reviewed and are negative.    Physical Exam Updated Vital Signs BP 106/92   Pulse 97   Temp 97.9 F (36.6 C)   Resp 22   Ht 5\' 4"  (1.626 m)   Wt 197 lb (89.4 kg)   SpO2 95%   BMI 33.81 kg/m   Physical Exam  Constitutional: She is oriented to person, place, and time. She appears well-developed and well-nourished.  HENT:  Head: Normocephalic and atraumatic.  Right Ear: External ear normal.  Left Ear: External ear normal.  Nose: Nose normal.  Eyes: EOM are normal. Pupils are equal, round, and reactive to light. Right eye exhibits no discharge. Left eye exhibits no discharge.  Neck: Neck supple.  Cardiovascular: Regular rhythm and normal heart sounds.  Tachycardia present.   Pulses:      Radial pulses are 2+ on the right side, and 2+ on the left side.       Dorsalis pedis pulses are 2+ on the right side, and 2+ on the left side.  HR ~100  Pulmonary/Chest: Effort normal and breath sounds normal. She has no wheezes. She has no rales.  Abdominal: Soft. She exhibits no distension. There is no tenderness.  Neurological: She is alert and oriented to person, place, and time.  CN 3-12 grossly intact. 5/5 strength in all 4 extremities. Grossly normal  sensation. Normal finger to nose.   Skin: Skin is warm and dry.  Nursing note and vitals reviewed.    ED Treatments / Results  Labs (all labs ordered are listed, but only abnormal results are displayed) Labs Reviewed  ETHANOL - Abnormal; Notable for the following:       Result Value   Alcohol, Ethyl (B) 10 (*)    All other components within normal limits  CBC - Abnormal; Notable for the following:    WBC 13.4 (*)    Hemoglobin 16.6 (*)    HCT 46.6 (*)    All other components within normal limits  DIFFERENTIAL - Abnormal; Notable for the following:    Neutro Abs 9.3 (*)    All other components within normal  limits  COMPREHENSIVE METABOLIC PANEL - Abnormal; Notable for the following:    Sodium 132 (*)    Glucose, Bld 263 (*)    All other components within normal limits  RAPID URINE DRUG SCREEN, HOSP PERFORMED - Abnormal; Notable for the following:    Amphetamines POSITIVE (*)    All other components within normal limits  URINALYSIS, ROUTINE W REFLEX MICROSCOPIC (NOT AT [facility]) - Abnormal; Notable for the following:    APPearance HAZY (*)    Glucose, UA 250 (*)    Ketones, ur TRACE (*)    Protein, ur TRACE (*)    Leukocytes, UA SMALL (*)    All other components within normal limits  URINE MICROSCOPIC-ADD ON - Abnormal; Notable for the following:    Squamous Epithelial / LPF TOO NUMEROUS TO COUNT (*)    Bacteria, UA MANY (*)    All other components within normal limits  CBG MONITORING, ED - Abnormal; Notable for the following:    Glucose-Capillary 299 (*)    All other components within normal limits  PROTIME-INR  APTT  TROPONIN I  PREGNANCY, URINE    EKG  EKG Interpretation  Date/Time:  Wednesday March 04 2016 12:27:16 EDT Ventricular Rate:  104 PR Interval:    QRS Duration: 89 QT Interval:  354 QTC Calculation: 466 R Axis:   30 Text Interpretation:  Sinus tachycardia no acute ST/T changes no significant change since Jan 2017 Confirmed by Criss Alvine MD, Kmarion Rawl  [phone]) on 03/04/2016 1:25:55 PM       Radiology Dg Chest 2 View  Result Date: 03/04/2016 CLINICAL DATA:  Chest pain and shortness of Breath EXAM: CHEST  2 VIEW COMPARISON:  06/02/2015 FINDINGS: The heart size and mediastinal contours are within normal limits. Both lungs are clear. The visualized skeletal structures are unremarkable. IMPRESSION: No active cardiopulmonary disease. Electronically Signed   By: Alcide Clever M.D.   On: 03/04/2016 14:12   Ct Head Wo Contrast  Result Date: 03/04/2016 CLINICAL DATA:  Headache and numbness extending into the left upper extremity. Left-sided heaviness. EXAM: CT HEAD WITHOUT CONTRAST TECHNIQUE: Contiguous axial images were obtained from the base of the skull through the vertex without intravenous contrast. COMPARISON:  CT head without contrast 06/02/2015. FINDINGS: Brain: No acute infarct, hemorrhage, or mass lesion is present. The ventricles are of normal size. No significant extraaxial fluid collection is present. Vascular: No hyperdense vessel or unexpected calcification. Skull: The calvarium is intact. Sinuses/Orbits: The paranasal sinuses and mastoid air cells are clear. The globes and orbits are intact. IMPRESSION: Negative CT of the head. Electronically Signed   By: Marin Roberts M.D.   On: 03/04/2016 14:20    Procedures Procedures (including critical care time)  Medications Ordered in ED Medications - No data to display   Initial Impression / Assessment and Plan / ED Course  I have reviewed the triage vital signs and the nursing notes.  Pertinent labs & imaging results that were available during my care of the patient were reviewed by me and considered in my medical decision making (see chart for details).  Clinical Course  Comment By Time  Patient is asymptomatic at this time. Unclear if this is/was cardiac or neurologic or neither. Will work up for TIA/CVA, ACS, give fluids and monitor Pricilla Loveless, MD 10/18 1320    Workup  here is essentially negative except for hyperglycemia without acidosis. Unclear etiology and currently she has no symptoms and has not had any symptoms while being here. I discussed  with her risk factors she should probably be observed for serial troponins and possibly MRI for a possible TIA/mild stroke. She currently has no strokelike symptoms. However patient states she does not want stay in the hospital. She understands we could be missing CNS or cardiac pathology and accepts that she could be developing a heart attack or stroke and possible consequences of this. She will follow-up with her doctor tomorrow. Discussed strict return precautions and she agrees to return if symptoms recur.  Final Clinical Impressions(s) / ED Diagnoses   Final diagnoses:  Chest pressure  Sensation of heaviness    New Prescriptions New Prescriptions   No medications on file     Pricilla Loveless, MD 03/04/16 1630

## 2016-03-04 NOTE — ED Notes (Signed)
Pt to xray at this time.

## 2016-03-04 NOTE — ED Notes (Signed)
Pt called out at this time stating she wishes to go home, MD Washburn[place]ston notified and in room.

## 2016-03-04 NOTE — ED Notes (Signed)
Pt told pharmacy that she had a panic attack yesterday and flushed her Adderall because she thought it caused her panic attack. Pt has not had her dose today.

## 2016-06-18 DEATH — deceased

## 2016-09-23 IMAGING — CT CT ANGIO CHEST
1 of 6 series · 5 of 36 positions shown · IV contrast (omnipaque)
Comparison: Chest radiograph 03/22/2014

CLINICAL DATA: 39-year-old female with syncope and altered mental
status.

EXAM:
CT ANGIOGRAPHY CHEST WITH CONTRAST
TECHNIQUE: Multidetector CT imaging of the chest was performed using the
standard protocol during bolus administration of intravenous
contrast. Multiplanar CT image reconstructions and MIPs were
obtained to evaluate the vascular anatomy.
CONTRAST:  100mL OMNIPAQUE IOHEXOL 350 MG/ML SOLN

[Series 4: pe 3.0 b40f · axial · 0.59mm/px · z∈[-100,+32]mm · 5 of 68 slices shown]
[im 12/68  lung]
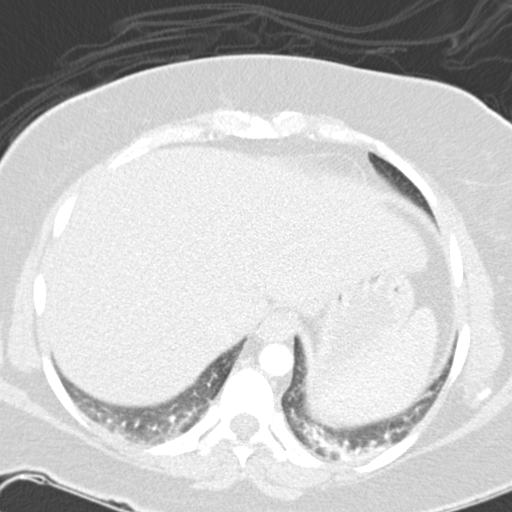
[im 23/68  mediastinal]
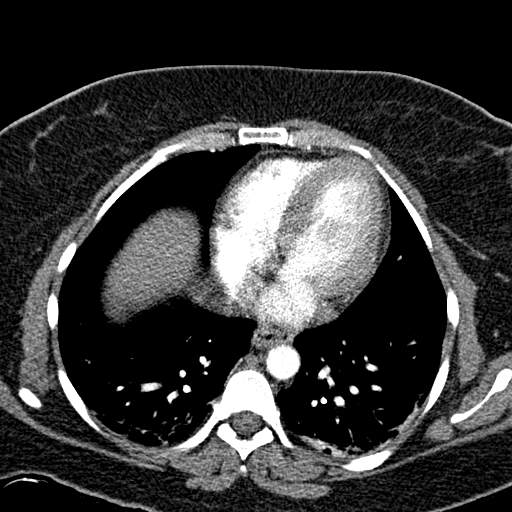
[im 34/68  lung]
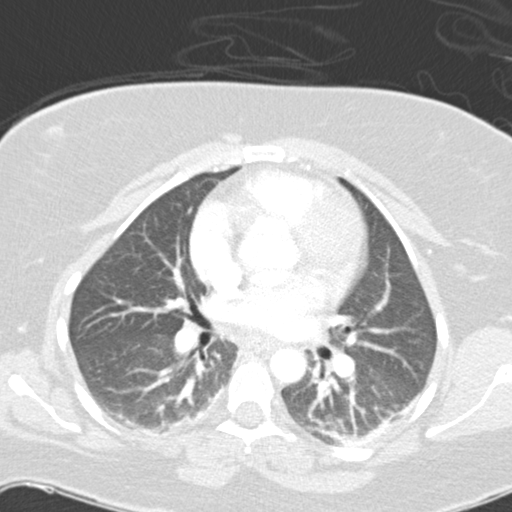
[im 45/68  mediastinal]
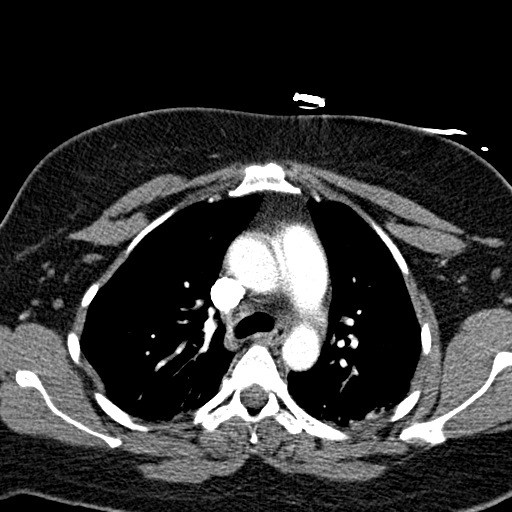
[im 56/68  lung]
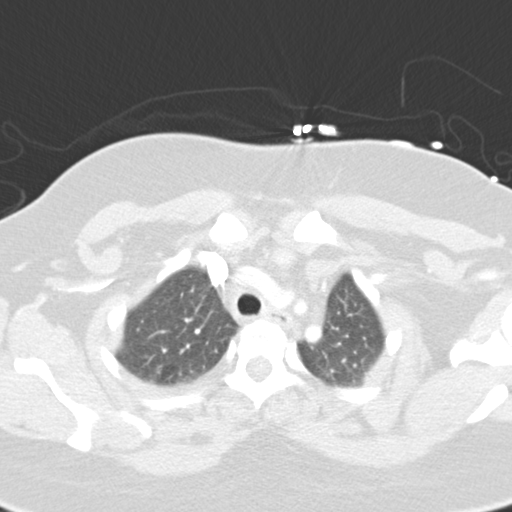

[5 of 36 positions shown; findings below may reference images not displayed]

FINDINGS: Minimal bibasilar dependent atelectatic change. The lungs are
otherwise clear. There is no pleural effusion or pneumothorax. The
central airways are patent.

The thoracic aorta appears unremarkable. No CT evidence of pulmonary
embolus. No cardiomegaly or pericardial effusion. There is no hilar
or mediastinal adenopathy. The esophagus is grossly unremarkable.
There is a subcentimeter left thyroid hypodense nodule. Ultrasound
may provide better evaluation. There is no axillary adenopathy. The
chest wall soft tissues appear unremarkable. The osseous structures
are intact.

The visualized upper abdomen is unremarkable.

Review of the MIP images confirms the above findings.
IMPRESSION: No CT evidence of pulmonary embolism.
